# Patient Record
Sex: Female | Born: 1965 | Race: White | Hispanic: No | Marital: Married | Smoking: Current every day smoker
Health system: Southern US, Community
[De-identification: ages and names within clinical notes are randomized; demographics above are authoritative.]

## PROBLEM LIST (undated history)

## (undated) DIAGNOSIS — K429 Umbilical hernia without obstruction or gangrene: Secondary | ICD-10-CM

## (undated) DIAGNOSIS — J45909 Unspecified asthma, uncomplicated: Secondary | ICD-10-CM

## (undated) DIAGNOSIS — R011 Cardiac murmur, unspecified: Secondary | ICD-10-CM

## (undated) DIAGNOSIS — I10 Essential (primary) hypertension: Secondary | ICD-10-CM

## (undated) HISTORY — PX: WRIST SURGERY: SHX841

## (undated) HISTORY — PX: TONSILLECTOMY: SUR1361

## (undated) HISTORY — PX: DILATION AND CURETTAGE OF UTERUS: SHX78

---

## 2016-12-27 ENCOUNTER — Encounter: Payer: Self-pay | Admitting: Emergency Medicine

## 2016-12-27 ENCOUNTER — Emergency Department
Admission: EM | Admit: 2016-12-27 | Discharge: 2016-12-27 | Discharge status: Against Medical Advice | Payer: PRIVATE HEALTH INSURANCE | Attending: Emergency Medicine | Admitting: Emergency Medicine

## 2016-12-27 ENCOUNTER — Emergency Department: Payer: PRIVATE HEALTH INSURANCE

## 2016-12-27 DIAGNOSIS — J189 Pneumonia, unspecified organism: Secondary | ICD-10-CM | POA: Diagnosis not present

## 2016-12-27 DIAGNOSIS — Z5320 Procedure and treatment not carried out because of patient's decision for unspecified reasons: Secondary | ICD-10-CM | POA: Insufficient documentation

## 2016-12-27 DIAGNOSIS — I10 Essential (primary) hypertension: Secondary | ICD-10-CM | POA: Diagnosis not present

## 2016-12-27 DIAGNOSIS — F17200 Nicotine dependence, unspecified, uncomplicated: Secondary | ICD-10-CM | POA: Insufficient documentation

## 2016-12-27 DIAGNOSIS — J441 Chronic obstructive pulmonary disease with (acute) exacerbation: Secondary | ICD-10-CM

## 2016-12-27 DIAGNOSIS — R0602 Shortness of breath: Secondary | ICD-10-CM | POA: Diagnosis present

## 2016-12-27 HISTORY — DX: Essential (primary) hypertension: I10

## 2016-12-27 LAB — CBC
HCT: 43.9 % (ref 35.0–47.0)
Hemoglobin: 14.8 g/dL (ref 12.0–16.0)
MCH: 30.8 pg (ref 26.0–34.0)
MCHC: 33.8 g/dL (ref 32.0–36.0)
MCV: 91.2 fL (ref 80.0–100.0)
PLATELETS: 198 10*3/uL (ref 150–440)
RBC: 4.81 MIL/uL (ref 3.80–5.20)
RDW: 14.6 % — AB (ref 11.5–14.5)
WBC: 10.4 10*3/uL (ref 3.6–11.0)

## 2016-12-27 LAB — BASIC METABOLIC PANEL
Anion gap: 12 (ref 5–15)
BUN: 10 mg/dL (ref 6–20)
CALCIUM: 8.7 mg/dL — AB (ref 8.9–10.3)
CO2: 27 mmol/L (ref 22–32)
CREATININE: 0.81 mg/dL (ref 0.44–1.00)
Chloride: 101 mmol/L (ref 101–111)
GFR calc non Af Amer: >60 mL/min (ref >60)
Glucose, Bld: 88 mg/dL (ref 65–99)
Potassium: 3.4 mmol/L — ABNORMAL LOW (ref 3.5–5.1)
SODIUM: 140 mmol/L (ref 135–145)

## 2016-12-27 LAB — TROPONIN I

## 2016-12-27 MED ORDER — ALBUTEROL SULFATE (2.5 MG/3ML) 0.083% IN NEBU
5.0000 mg | INHALATION_SOLUTION | Freq: Once | RESPIRATORY_TRACT | Status: AC
  Administered 2016-12-27: 5 mg via RESPIRATORY_TRACT
  Filled 2016-12-27: qty 6

## 2016-12-27 MED ORDER — PREDNISONE 20 MG PO TABS
60.0000 mg | ORAL_TABLET | Freq: Once | ORAL | Status: AC
  Administered 2016-12-27: 60 mg via ORAL
  Filled 2016-12-27: qty 3

## 2016-12-27 MED ORDER — PREDNISONE 20 MG PO TABS: 40.0000 mg | ORAL_TABLET | Freq: Every day | ORAL | 0 refills | Status: DC

## 2016-12-27 MED ORDER — METOPROLOL TARTRATE 25 MG PO TABS
25.0000 mg | ORAL_TABLET | Freq: Two times a day (BID) | ORAL | 0 refills | Status: DC
Start: 2016-12-27 — End: 2017-05-07

## 2016-12-27 MED ORDER — DOXYCYCLINE HYCLATE 100 MG PO CAPS: 100.0000 mg | ORAL_CAPSULE | Freq: Two times a day (BID) | ORAL | 0 refills | Status: DC

## 2016-12-27 MED ORDER — ALBUTEROL SULFATE HFA 108 (90 BASE) MCG/ACT IN AERS: 2.0000 | INHALATION_SPRAY | RESPIRATORY_TRACT | 0 refills | Status: DC | PRN

## 2016-12-27 MED ORDER — HYDROCHLOROTHIAZIDE 25 MG PO TABS: 25.0000 mg | ORAL_TABLET | Freq: Every day | ORAL | 0 refills | Status: DC

## 2016-12-27 NOTE — ED Notes (Signed)
Pt stating that when she coughs she urinates. Given pads.

## 2016-12-27 NOTE — ED Notes (Signed)
Pt ambulatory to toilet by self.  

## 2016-12-27 NOTE — ED Provider Notes (Addendum)
[facility] Emergency Department Provider Note  ____________________________________________  Time seen: Approximately 4:41 PM  I have reviewed the triage vital signs and the nursing notes.   HISTORY  Chief Complaint Shortness of Breath    HPI Courtney Berger is a 51 y.o. female who complains of shortness of breath and nonproductive cough for the past 2 days. Feels like bronchitis that she's had in the past. She has an albuterol inhaler, but that she just moved to the stay, she still unpacking and couldn't find it. She also feels like she had a fever of 1022 days ago, which is now resolved. Has a history of hypertension, but ran out of her metoprolol and hydrochlorothiazide due to lack of access to primary care. Denies chest pain. Shortness of breath is constant, no aggravating or alleviating factors, mild to moderate intensity. No chills or sweats. No syncope.     Past Medical History:  Diagnosis Date  . Hypertension      There are no active problems to display for this patient.    Past Surgical History:  Procedure Laterality Date  . WRIST SURGERY       Prior to Admission medications   Medication Sig Start Date End Date Taking? Authorizing Provider  albuterol (PROVENTIL HFA) 108 (90 Base) MCG/ACT inhaler Inhale 2 puffs into the lungs every 4 (four) hours as needed for wheezing or shortness of breath. 12/27/16   Sharman Cheek, MD  doxycycline (VIBRAMYCIN) 100 MG capsule Take 1 capsule (100 mg total) by mouth 2 (two) times daily. 12/27/16   Sharman Cheek, MD  hydrochlorothiazide (HYDRODIURIL) 25 MG tablet Take 1 tablet (25 mg total) by mouth daily. 12/27/16   Sharman Cheek, MD  metoprolol tartrate (LOPRESSOR) 25 MG tablet Take 1 tablet (25 mg total) by mouth 2 (two) times daily. 12/27/16 12/27/17  Sharman Cheek, MD  predniSONE (DELTASONE) 20 MG tablet Take 2 tablets (40 mg total) by mouth daily. 12/27/16   Sharman Cheek, MD      Allergies Zyrtec [cetirizine]   No family history on file.  Social History Social History  Substance Use Topics  . Smoking status: Current Every Day Smoker    Packs/day: 0.50  . Smokeless tobacco: Never Used  . Alcohol use Yes    Review of Systems  Constitutional:   Positive fever, now resolved, without chills.  ENT:   Positive sore throat. No rhinorrhea. Cardiovascular:   No chest pain or syncope. Respiratory:   Positive shortness of breath and nonproductive cough. Gastrointestinal:   Negative for abdominal pain, vomiting and diarrhea.  Musculoskeletal:   Negative for focal pain or swelling All other systems reviewed and are negative except as documented above in ROS and HPI.  ____________________________________________   PHYSICAL EXAM:  VITAL SIGNS: ED Triage Vitals  Enc Vitals Group     BP 12/27/16 1537 (!) 198/100     Pulse Rate 12/27/16 1537 92     Resp 12/27/16 1537 20     Temp 12/27/16 1537 98.7 F (37.1 C)     Temp Source 12/27/16 1537 Oral     SpO2 12/27/16 1537 (!) 89 %     Weight 12/27/16 1538 260 lb (117.9 kg)     Height 12/27/16 1538 5\' 5"  (1.651 m)     Head Circumference --      Peak Flow --      Pain Score --      Pain Loc --      Pain Edu? --  Excl. in GC? --     Vital signs reviewed, nursing assessments reviewed.   Constitutional:   Alert and oriented. Well appearing and in no distress. Eyes:   No scleral icterus.  EOMI. No nystagmus. No conjunctival pallor. PERRL. ENT   Head:   Normocephalic and atraumatic.   Nose:   No congestion/rhinnorhea.    Mouth/Throat:   MMM, no pharyngeal erythema. No peritonsillar mass.    Neck:   No meningismus. Full ROM. Hematological/Lymphatic/Immunilogical:   No cervical lymphadenopathy. Cardiovascular:   RRR. Symmetric bilateral radial and DP pulses. Diffuse loud systolic murmur, radiating to right carotid, also radiating to left axilla.  Respiratory:   Normal respiratory effort  without tachypnea/retractions. Good air entry in all lung fields. Diffuse expiratory wheezing.. Gastrointestinal:   Soft and nontender. Non distended. There is no CVA tenderness.  No rebound, rigidity, or guarding. Genitourinary:   deferred Musculoskeletal:   Normal range of motion in all extremities. No joint effusions.  No lower extremity tenderness.  No edema. Neurologic:   Normal speech and language.  Motor grossly intact. No gross focal neurologic deficits are appreciated.  Skin:    Skin is warm, dry and intact. No rash noted.  No petechiae, purpura, or bullae.  ____________________________________________    LABS (pertinent positives/negatives) (all labs ordered are listed, but only abnormal results are displayed) Labs Reviewed  BASIC METABOLIC PANEL - Abnormal; Notable for the following:       Result Value   Potassium 3.4 (*)    Calcium 8.7 (*)    All other components within normal limits  CBC - Abnormal; Notable for the following:    RDW 14.6 (*)    All other components within normal limits  TROPONIN I   ____________________________________________   EKG  Interpreted by me Normal sinus rhythm rate of 87, normal axis intervals gross ST segments and T waves.  ____________________________________________    RADIOLOGY  Dg Chest 2 View  Result Date: 12/27/2016 CLINICAL DATA:  Cough and shortness of Breath EXAM: CHEST  2 VIEW COMPARISON:  None. FINDINGS: Cardiac shadow is within normal limits. Left nipple piercing is seen. No focal infiltrate or sizable effusion is noted. Degenerative changes of the thoracic spine are noted. IMPRESSION: No active cardiopulmonary disease. Electronically Signed   By: Alcide Clever M.D.   On: 12/27/2016 16:19    ____________________________________________   PROCEDURES Procedures  ____________________________________________   DIFFERENTIAL DIAGNOSIS  Pneumothorax, pneumonia, COPD exacerbation/acute bronchitis  CLINICAL  IMPRESSION / ASSESSMENT AND PLAN / ED COURSE  Pertinent labs & imaging results that were available during my care of the patient were reviewed by me and considered in my medical decision making (see chart for details).   Low suspicion of ACS dissection PE. Patient is not septic. Initially had a borderline low oxygen saturation, after bronchodilators and steroids, oxygenation is normalized. Symptoms are improved and wheezing is improved. Patient may be suitable for outpatient management with albuterol, prednisone, doxycycline and given likely underlying lung disease. Also refill her metoprolol and hydrochlorothiazide. Discussed with the patient her systolic murmur, given her age and the prominence of these murmurs, recommended she follow up with cardiology for echo. No evidence of heart failure or endocarditis.     ----------------------------------------- 8:52 PM on 12/27/2016 -----------------------------------------  On further assessment while planning for discharge, as noted in the patient's oxygen saturation Dropping into the mid 80s, down to 85%. Weight back and had a discussion with her recommended she stay in the hospital overnight due to persistent hypoxia  with acute respiratory failure, she refuses because she has a 10881-year-old at home, and her husband who is currently taking care of him is not the boy's biological father and she feels that her husband does not do a good job taking care of the 51-year-old, so she feels that she needs to be home to take care of the 51-year-old for his well-being. She has medical decision-making capacity so discharge her AGAINST MEDICAL ADVICE.  ____________________________________________   FINAL CLINICAL IMPRESSION(S) / ED DIAGNOSES    Final diagnoses:  COPD exacerbation (HCC)  Community acquired pneumonia, unspecified laterality  Hypertension, unspecified type      New Prescriptions   ALBUTEROL (PROVENTIL HFA) 108 (90 BASE) MCG/ACT INHALER     Inhale 2 puffs into the lungs every 4 (four) hours as needed for wheezing or shortness of breath.   DOXYCYCLINE (VIBRAMYCIN) 100 MG CAPSULE    Take 1 capsule (100 mg total) by mouth 2 (two) times daily.   HYDROCHLOROTHIAZIDE (HYDRODIURIL) 25 MG TABLET    Take 1 tablet (25 mg total) by mouth daily.   METOPROLOL TARTRATE (LOPRESSOR) 25 MG TABLET    Take 1 tablet (25 mg total) by mouth 2 (two) times daily.   PREDNISONE (DELTASONE) 20 MG TABLET    Take 2 tablets (40 mg total) by mouth daily.     Portions of this note were generated with dragon dictation software. Dictation errors may occur despite best attempts at proofreading.    Sharman CheekStafford, Dario Yono, MD 12/27/16 2022    Sharman CheekStafford, Milina Pagett, MD 12/27/16 Derek [id number]    Sharman CheekStafford, Xavyer Steenson, MD 12/27/16 [phone]

## 2016-12-27 NOTE — ED Notes (Signed)
Pt states when she coughs her oxygen drops but will come back up after coughing fit is done.

## 2016-12-27 NOTE — ED Notes (Signed)
Pt once again asked to stay d/t oxygen saturation and symptoms. Pt refusing to stay.

## 2016-12-27 NOTE — ED Notes (Addendum)
Pt states 3 days ago had a fever of 103. Since then unsure of fever. States she feels like she has bronchitis. States congestion, cough, "itchy" throat. Pt sounds congested. Pt states hx of HTN but hasn't taken medication x months d/t moving here and unable to set up PCP yet. Pt given resources for PCP in the area. Pt is alert, oriented, ambulatory. No resp distress noted at this time.   Pt states she was taking metoprolol and HCTZ for HTN.

## 2016-12-27 NOTE — ED Notes (Signed)
Pt oxygen saturations dropping. Pt refused to wear oxygen earlier stating that she is fine and it only happens when she coughs. This Rn went in to talk to pt about observing her oxygen. Pt states "I can't stay any more. No one has been in here. I got to get home to my 51 year old." pt informed that her oxygen is dropping without coughing while this RN is talking to pt. Dr. Scotty CourtStafford informed that pt states she will not stay for admission.

## 2016-12-27 NOTE — ED Triage Notes (Addendum)
Pt comes into the ED via POV c/o shortness of breath and productive cough.  Patient presents with audible wheezing and low O2 saturation at 89%-92% room air.  Patient denies any pain except when coughing.  States this has been going for 3-4 days.  Denies any chest pain or dizziness. Patient ambulatory to triage and has even and unlabored respirations.  States she had a fever of 103 two days ago.  Denies COPD or asthma.

## 2017-05-07 ENCOUNTER — Encounter: Payer: Self-pay | Admitting: Physician Assistant

## 2017-05-07 ENCOUNTER — Ambulatory Visit (INDEPENDENT_AMBULATORY_CARE_PROVIDER_SITE_OTHER): Payer: Self-pay | Admitting: Physician Assistant

## 2017-05-07 VITALS — BP 180/108 | HR 70 | Temp 98.5°F | Resp 16 | Ht 65.0 in | Wt 272.0 lb

## 2017-05-07 DIAGNOSIS — Z13220 Encounter for screening for lipoid disorders: Secondary | ICD-10-CM

## 2017-05-07 DIAGNOSIS — Z130 Encounter for screening for diseases of the blood and blood-forming organs and certain disorders involving the immune mechanism: Secondary | ICD-10-CM

## 2017-05-07 DIAGNOSIS — R0981 Nasal congestion: Secondary | ICD-10-CM

## 2017-05-07 DIAGNOSIS — Z720 Tobacco use: Secondary | ICD-10-CM

## 2017-05-07 DIAGNOSIS — R011 Cardiac murmur, unspecified: Secondary | ICD-10-CM

## 2017-05-07 DIAGNOSIS — I10 Essential (primary) hypertension: Secondary | ICD-10-CM

## 2017-05-07 DIAGNOSIS — M25562 Pain in left knee: Secondary | ICD-10-CM

## 2017-05-07 DIAGNOSIS — E669 Obesity, unspecified: Secondary | ICD-10-CM

## 2017-05-07 DIAGNOSIS — Z114 Encounter for screening for human immunodeficiency virus [HIV]: Secondary | ICD-10-CM

## 2017-05-07 MED ORDER — METOPROLOL TARTRATE 25 MG PO TABS: 25.0000 mg | ORAL_TABLET | Freq: Two times a day (BID) | ORAL | 0 refills | Status: DC

## 2017-05-07 NOTE — Progress Notes (Signed)
Patient: Courtney Berger Female    DOB: 02/18/1966   52 y.o.   MRN: [id number] Visit Date: 05/07/2017  Today's Provider: Trey SailorsAdriana M Shantale Holtmeyer, PA-C   Chief Complaint  Patient presents with  . Establish Care  . Hypertension   Subjective:    Courtney Berger is a 52 y/o woman who presents here with her husband today to establish care. She has previously been seen by multiple PCPs but nobody consistently.  She lives in [place]. She has seven biological children. She lives with her husband and [id number] year old son. She works at [employer] as a Conservation officer, naturecashier.  She does not use alcohol or drugs. She is currently smoking 1/2 pack per day. She reports she has smoked since she was 15 for a total of 37 years. She reports she has smoked more than half a pack at one point but doesn't recall how much and for how long.  She has a longstanding history of hypertension. She reports most recently she was Rx metoprolol 25 mg BID and 12.5 mg HCTZ from the ER. She only had a month's worth of medication. She hasn't been taking it consistently because she didn't want to run out. She has been taking one about every other day. She has run out of her HCTZ.    She also reports a history of a murmur that was again demonstrated in the ER. She reports she has never been worked up for this but it has been present since she was a child.   She also brings up weight loss and wants to know why she had gained 40lbs when she moved here from [place]. She says it's likely because her husband makes good food and she does nothing all day.  She also brings up sinus congestion and wants to know what she can do about it.   She also brings up left knee pain that swells intermittently from her left knee down. She wants to know if this is related to her BP. She recalls no injuries to this leg. 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 She is on her feet all day at work. She has no numbness or tingling. She feels she may be retaining  fluid.   She did not fast today, she had tea with honey and gummy bears for breakfast.  Hypertension  This is a new problem. The problem is uncontrolled. Pertinent negatives include no anxiety, blurred vision, chest pain, headaches, malaise/fatigue, neck pain, orthopnea, palpitations, peripheral edema, PND, shortness of breath or sweats. There are no associated agents to hypertension.       Allergies  Allergen Reactions  . Zyrtec [Cetirizine] Itching     Current Outpatient Medications:  .  albuterol (PROVENTIL HFA) 108 (90 Base) MCG/ACT inhaler, Inhale 2 puffs into the lungs every 4 (four) hours as needed for wheezing or shortness of breath., Disp: 1 Inhaler, Rfl: 0 .  hydrochlorothiazide (HYDRODIURIL) 25 MG tablet, Take 1 tablet (25 mg total) by mouth daily., Disp: 30 tablet, Rfl: 0 .  metoprolol tartrate (LOPRESSOR) 25 MG tablet, Take 1 tablet (25 mg total) by mouth 2 (two) times daily., Disp: 60 tablet, Rfl: 0 .  doxycycline (VIBRAMYCIN) 100 MG capsule, Take 1 capsule (100 mg total) by mouth 2 (two) times daily., Disp: 28 capsule, Rfl: 0 .  predniSONE (DELTASONE) 20 MG tablet, Take 2 tablets (40 mg total) by mouth daily., Disp: 8 tablet, Rfl: 0  Review of Systems  Constitutional:  Negative.  Negative for malaise/fatigue.  HENT: Negative.   Eyes: Negative.  Negative for blurred vision.  Respiratory: Negative.  Negative for shortness of breath.   Cardiovascular: Negative.  Negative for chest pain, palpitations, orthopnea and PND.  Gastrointestinal: Negative.   Endocrine: Negative.   Genitourinary: Negative.   Musculoskeletal: Negative.  Negative for neck pain.  Skin: Negative.   Allergic/Immunologic: Negative.   Neurological: Negative.  Negative for headaches.  Hematological: Negative.   Psychiatric/Behavioral: Negative.     Social History   Tobacco Use  . Smoking status: Current Every Day Smoker    Packs/day: 0.50  . Smokeless tobacco: Never Used  Substance Use Topics    . Alcohol use: No    Frequency: Never   Objective:   BP (!) 180/108 (BP Location: Right Arm, Patient Position: Sitting, Cuff Size: Large)   Pulse 70   Temp 98.5 F (36.9 C) (Oral)   Resp 16   Ht 5\' 5"  (1.651 m)   Wt 272 lb (123.4 kg)   BMI 45.26 kg/m  Vitals:   05/07/17 0928  BP: (!) 180/108  Pulse: 70  Resp: 16  Temp: 98.5 F (36.9 C)  TempSrc: Oral  Weight: 272 lb (123.4 kg)  Height: 5\' 5"  (1.651 m)     Physical Exam  Constitutional: She is oriented to person, place, and time. She appears well-developed and well-nourished.  HENT:  Right Ear: External ear normal.  Left Ear: External ear normal.  Mouth/Throat: Oropharynx is clear and moist. No oropharyngeal exudate.  Eyes: Conjunctivae are normal.  Neck: Neck supple. No thyromegaly present.  Cardiovascular: Normal rate and regular rhythm.  Murmur heard.  Systolic murmur is present with a grade of 3/6. Systolic murmur best heard in the RUSB.  Pulmonary/Chest: Effort normal and breath sounds normal.  Abdominal: Soft. Bowel sounds are normal.  Musculoskeletal:  Adiposity of of thighs bilaterally makes palpation of landmarks difficult. Left knee is non tender to palpation. Crepitus felt with flexion and extension. No weakness noted. No erythema or redness.   Lymphadenopathy:    She has no cervical adenopathy.  Neurological: She is alert and oriented to person, place, and time.  Skin: Skin is warm and dry.  Psychiatric: She has a normal mood and affect. Her behavior is normal.        Assessment & Plan:     1. Hypertension, unspecified type  She was prescribed metoprolol and HCTZ by the ER. She hasn't been taking them consistently because she only had a month's supply and she got these medications in October. I have communicated that I do not think metoprolol is a good first line medication as it will not be enough to lower her BP. She doesn't want to take amlodipine because of swelling. I also do not think HCTZ is  good first line for this high of BP. I do not have recent labwork on her so will start back metoprolol and have her follow up. Will likely need to add another medication.  - Comprehensive Metabolic Panel (CMET) - metoprolol tartrate (LOPRESSOR) 25 MG tablet; Take 1 tablet (25 mg total) by mouth 2 (two) times daily.  Dispense: 180 tablet; Refill: 0  2. Obesity with serious comorbidity, unspecified classification, unspecified obesity type  Keep a food log and bring it to next visit.  - TSH  3. Tobacco abuse   4. Encounter for screening for HIV  - HIV antibody (with reflex)  5. Screening for deficiency anemia  - CBC with Differential  6. Screening cholesterol level  - Lipid Profile  7. Systolic murmur  - Ambulatory referral to Cardiology  8. Sinus congestion  Take 2nd gen antihistamine daily.  9. Left knee pain, unspecified chronicity  Suspect arthritis. Anti-inflammatories to tx, weight loss advised. Xray if not improving.  Return in about 2 weeks (around 05/21/2017) for HTN.  The entirety of the information documented in the History of Present Illness, Review of Systems and Physical Exam were personally obtained by me. Portions of this information were initially documented by Kavin Leech, CMA and reviewed by me for thoroughness and accuracy.          Trey Sailors, PA-C  [facility]

## 2017-05-07 NOTE — Patient Instructions (Signed)

## 2017-05-08 LAB — CBC WITH DIFFERENTIAL/PLATELET
Basophils Absolute: 0 x10E3/uL (ref 0.0–0.2)
Basos: 0 %
EOS (ABSOLUTE): 0.1 x10E3/uL (ref 0.0–0.4)
Eos: 1 %
Hematocrit: 43.6 % (ref 34.0–46.6)
Hemoglobin: 14.7 g/dL (ref 11.1–15.9)
Immature Grans (Abs): 0 x10E3/uL (ref 0.0–0.1)
Immature Granulocytes: 0 %
Lymphocytes Absolute: 2.3 x10E3/uL (ref 0.7–3.1)
Lymphs: 31 %
MCH: 30.5 pg (ref 26.6–33.0)
MCHC: 33.7 g/dL (ref 31.5–35.7)
MCV: 91 fL (ref 79–97)
Monocytes Absolute: 0.5 x10E3/uL (ref 0.1–0.9)
Monocytes: 6 %
Neutrophils Absolute: 4.6 x10E3/uL (ref 1.4–7.0)
Neutrophils: 62 %
Platelets: 236 x10E3/uL (ref 150–379)
RBC: 4.82 x10E6/uL (ref 3.77–5.28)
RDW: 14.4 % (ref 12.3–15.4)
WBC: 7.5 x10E3/uL (ref 3.4–10.8)

## 2017-05-08 LAB — COMPREHENSIVE METABOLIC PANEL WITH GFR
ALT: 9 IU/L (ref 0–32)
AST: 13 IU/L (ref 0–40)
Albumin/Globulin Ratio: 1.6 (ref 1.2–2.2)
Albumin: 4.6 g/dL (ref 3.5–5.5)
Alkaline Phosphatase: 66 IU/L (ref 39–117)
BUN/Creatinine Ratio: 16 (ref 9–23)
BUN: 13 mg/dL (ref 6–24)
Bilirubin Total: 0.3 mg/dL (ref 0.0–1.2)
CO2: 26 mmol/L (ref 20–29)
Calcium: 9.4 mg/dL (ref 8.7–10.2)
Chloride: 100 mmol/L (ref 96–106)
Creatinine, Ser: 0.81 mg/dL (ref 0.57–1.00)
GFR calc Af Amer: 97 mL/min/1.73
GFR calc non Af Amer: 84 mL/min/1.73
Globulin, Total: 2.8 g/dL (ref 1.5–4.5)
Glucose: 67 mg/dL (ref 65–99)
Potassium: 4.2 mmol/L (ref 3.5–5.2)
Sodium: 142 mmol/L (ref 134–144)
Total Protein: 7.4 g/dL (ref 6.0–8.5)

## 2017-05-08 LAB — HIV ANTIBODY (ROUTINE TESTING W REFLEX): HIV Screen 4th Generation wRfx: NONREACTIVE

## 2017-05-08 LAB — TSH: TSH: 0.963 u[IU]/mL (ref 0.450–4.500)

## 2017-05-08 LAB — LIPID PANEL
Chol/HDL Ratio: 4.9 ratio — ABNORMAL HIGH (ref 0.0–4.4)
Cholesterol, Total: 233 mg/dL — ABNORMAL HIGH (ref 100–199)
HDL: 48 mg/dL
LDL Calculated: 155 mg/dL — ABNORMAL HIGH (ref 0–99)
Triglycerides: 149 mg/dL (ref 0–149)
VLDL Cholesterol Cal: 30 mg/dL (ref 5–40)

## 2017-05-09 DIAGNOSIS — E669 Obesity, unspecified: Secondary | ICD-10-CM | POA: Insufficient documentation

## 2017-05-09 DIAGNOSIS — Z720 Tobacco use: Secondary | ICD-10-CM | POA: Insufficient documentation

## 2017-05-09 DIAGNOSIS — R011 Cardiac murmur, unspecified: Secondary | ICD-10-CM | POA: Insufficient documentation

## 2017-05-10 ENCOUNTER — Telehealth: Payer: Self-pay

## 2017-05-10 NOTE — Telephone Encounter (Signed)
-----   Message from Trey SailorsAdriana M Pollak, [place]-C sent at 05/10/2017  1:39 PM EST ----- Labs are normal but for her cholesterol. While elevated, she will not need treatment if we can control her BP and work on quitting smoking. See her at follow up.

## 2017-05-10 NOTE — Telephone Encounter (Signed)
Pt advised.   Thanks,   -Cadyn Rodger  

## 2017-05-22 ENCOUNTER — Encounter: Payer: Self-pay | Admitting: Physician Assistant

## 2017-05-22 ENCOUNTER — Ambulatory Visit: Payer: Medicaid Other | Admitting: Physician Assistant

## 2017-05-22 VITALS — BP 174/108 | HR 72 | Temp 98.9°F | Resp 16 | Wt 274.0 lb

## 2017-05-22 DIAGNOSIS — Z598 Other problems related to housing and economic circumstances: Secondary | ICD-10-CM

## 2017-05-22 DIAGNOSIS — Z599 Problem related to housing and economic circumstances, unspecified: Secondary | ICD-10-CM

## 2017-05-22 DIAGNOSIS — I10 Essential (primary) hypertension: Secondary | ICD-10-CM

## 2017-05-22 MED ORDER — AMLODIPINE BESYLATE 10 MG PO TABS: ORAL_TABLET | ORAL | 0 refills | Status: DC

## 2017-05-22 NOTE — Patient Instructions (Addendum)
Dr. Mariah MillingGollan, appointment 06/24/2017 at 10:00 AM [phone]      Hypertension Hypertension is another name for high blood pressure. High blood pressure forces your heart to work harder to pump blood. This can cause problems over time. There are two numbers in a blood pressure reading. There is a top number (systolic) over a bottom number (diastolic). It is best to have a blood pressure below 120/80. Healthy choices can help lower your blood pressure. You may need medicine to help lower your blood pressure if:  Your blood pressure cannot be lowered with healthy choices.  Your blood pressure is higher than 130/80.  Follow these instructions at home: Eating and drinking  If directed, follow the DASH eating plan. This diet includes: ? Filling half of your plate at each meal with fruits and vegetables. ? Filling one quarter of your plate at each meal with whole grains. Whole grains include whole wheat pasta, brown rice, and whole grain bread. ? Eating or drinking low-fat dairy products, such as skim milk or low-fat yogurt. ? Filling one quarter of your plate at each meal with low-fat (lean) proteins. Low-fat proteins include fish, skinless chicken, eggs, beans, and tofu. ? Avoiding fatty meat, cured and processed meat, or chicken with skin. ? Avoiding premade or processed food.  Eat less than 1,500 mg of salt (sodium) a day.  Limit alcohol use to no more than 1 drink a day for nonpregnant women and 2 drinks a day for men. One drink equals 12 oz of beer, 5 oz of wine, or 1 oz of hard liquor. Lifestyle  Work with your doctor to stay at a healthy weight or to lose weight. Ask your doctor what the best weight is for you.  Get at least 30 minutes of exercise that causes your heart to beat faster (aerobic exercise) most days of the week. This may include walking, swimming, or biking.  Get at least 30 minutes of exercise that strengthens your muscles (resistance exercise) at least 3 days a  week. This may include lifting weights or pilates.  Do not use any products that contain nicotine or tobacco. This includes cigarettes and e-cigarettes. If you need help quitting, ask your doctor.  Check your blood pressure at home as told by your doctor.  Keep all follow-up visits as told by your doctor. This is important. Medicines  Take over-the-counter and prescription medicines only as told by your doctor. Follow directions carefully.  Do not skip doses of blood pressure medicine. The medicine does not work as well if you skip doses. Skipping doses also puts you at risk for problems.  Ask your doctor about side effects or reactions to medicines that you should watch for. Contact a doctor if:  You think you are having a reaction to the medicine you are taking.  You have headaches that keep coming back (recurring).  You feel dizzy.  You have swelling in your ankles.  You have trouble with your vision. Get help right away if:  You get a very bad headache.  You start to feel confused.  You feel weak or numb.  You feel faint.  You get very bad pain in your: ? Chest. ? Belly (abdomen).  You throw up (vomit) more than once.  You have trouble breathing. Summary  Hypertension is another name for high blood pressure.  Making healthy choices can help lower blood pressure. If your blood pressure cannot be controlled with healthy choices, you may need to take medicine. This information  This information is not intended to replace advice given to you by your health care provider. Make sure you discuss any questions you have with your health care provider. Document Released: 08/08/2007 Document Revised: 01/18/2016 Document Reviewed: 01/18/2016 Elsevier Interactive Patient Education  2018 [employer]  
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
## 2017-06-20 ENCOUNTER — Ambulatory Visit: Payer: Self-pay | Admitting: Physician Assistant

## 2017-06-24 ENCOUNTER — Ambulatory Visit: Payer: PRIVATE HEALTH INSURANCE | Admitting: Cardiovascular Disease

## 2017-08-20 ENCOUNTER — Other Ambulatory Visit: Payer: Self-pay

## 2017-08-20 DIAGNOSIS — I10 Essential (primary) hypertension: Secondary | ICD-10-CM

## 2017-08-20 NOTE — Telephone Encounter (Signed)
Pt requesting refill of BP amlodipine and metoprolol. [facility].

## 2017-08-21 ENCOUNTER — Other Ambulatory Visit: Payer: Self-pay | Admitting: Physician Assistant

## 2017-08-21 DIAGNOSIS — I10 Essential (primary) hypertension: Secondary | ICD-10-CM

## 2017-08-21 MED ORDER — AMLODIPINE BESYLATE 10 MG PO TABS
5.0000 mg | ORAL_TABLET | Freq: Every day | ORAL | 0 refills | Status: DC
Start: 2017-08-21 — End: 2019-02-06

## 2017-08-21 MED ORDER — METOPROLOL TARTRATE 25 MG PO TABS: 25.0000 mg | ORAL_TABLET | Freq: Two times a day (BID) | ORAL | 0 refills | Status: DC

## 2017-08-21 NOTE — Telephone Encounter (Signed)
Needs OV before more refills. 

## 2019-01-31 ENCOUNTER — Emergency Department: Payer: Medicaid Other

## 2019-01-31 ENCOUNTER — Emergency Department
Admission: EM | Admit: 2019-01-31 | Discharge: 2019-01-31 | Disposition: A | Discharge status: Home / Self Care | Payer: Medicaid Other | Attending: Student | Admitting: Student

## 2019-01-31 ENCOUNTER — Other Ambulatory Visit: Payer: Self-pay

## 2019-01-31 ENCOUNTER — Encounter: Payer: Self-pay | Admitting: Emergency Medicine

## 2019-01-31 DIAGNOSIS — K429 Umbilical hernia without obstruction or gangrene: Secondary | ICD-10-CM

## 2019-01-31 DIAGNOSIS — R1907 Generalized intra-abdominal and pelvic swelling, mass and lump: Secondary | ICD-10-CM | POA: Diagnosis present

## 2019-01-31 DIAGNOSIS — F17210 Nicotine dependence, cigarettes, uncomplicated: Secondary | ICD-10-CM | POA: Insufficient documentation

## 2019-01-31 DIAGNOSIS — I10 Essential (primary) hypertension: Secondary | ICD-10-CM | POA: Insufficient documentation

## 2019-01-31 DIAGNOSIS — K5900 Constipation, unspecified: Secondary | ICD-10-CM | POA: Insufficient documentation

## 2019-01-31 LAB — COMPREHENSIVE METABOLIC PANEL
ALT: 14 U/L (ref 0–44)
AST: 18 U/L (ref 15–41)
Albumin: 4.4 g/dL (ref 3.5–5.0)
Alkaline Phosphatase: 51 U/L (ref 38–126)
Anion gap: 11 (ref 5–15)
BUN: 27 mg/dL — ABNORMAL HIGH (ref 6–20)
CO2: 26 mmol/L (ref 22–32)
Calcium: 9.4 mg/dL (ref 8.9–10.3)
Chloride: 102 mmol/L (ref 98–111)
Creatinine, Ser: 0.96 mg/dL (ref 0.44–1.00)
GFR calc Af Amer: >60 mL/min (ref >60)
GFR calc non Af Amer: >60 mL/min (ref >60)
Glucose, Bld: 87 mg/dL (ref 70–99)
Potassium: 4 mmol/L (ref 3.5–5.1)
Sodium: 139 mmol/L (ref 135–145)
Total Bilirubin: 0.5 mg/dL (ref 0.3–1.2)
Total Protein: 8.2 g/dL — ABNORMAL HIGH (ref 6.5–8.1)

## 2019-01-31 LAB — CBC WITH DIFFERENTIAL/PLATELET
Abs Immature Granulocytes: 0.04 10*3/uL (ref 0.00–0.07)
Basophils Absolute: 0 10*3/uL (ref 0.0–0.1)
Basophils Relative: 0 %
Eosinophils Absolute: 0.2 10*3/uL (ref 0.0–0.5)
Eosinophils Relative: 2 %
HCT: 42.6 % (ref 36.0–46.0)
Hemoglobin: 14.6 g/dL (ref 12.0–15.0)
Immature Granulocytes: 0 %
Lymphocytes Relative: 34 %
Lymphs Abs: 3.4 10*3/uL (ref 0.7–4.0)
MCH: 30.5 pg (ref 26.0–34.0)
MCHC: 34.3 g/dL (ref 30.0–36.0)
MCV: 88.9 fL (ref 80.0–100.0)
Monocytes Absolute: 0.5 10*3/uL (ref 0.1–1.0)
Monocytes Relative: 5 %
Neutro Abs: 5.7 10*3/uL (ref 1.7–7.7)
Neutrophils Relative %: 59 %
Platelets: 219 10*3/uL (ref 150–400)
RBC: 4.79 MIL/uL (ref 3.87–5.11)
RDW: 13.6 % (ref 11.5–15.5)
WBC: 9.9 10*3/uL (ref 4.0–10.5)
nRBC: 0 % (ref 0.0–0.2)

## 2019-01-31 LAB — LACTIC ACID, PLASMA: Lactic Acid, Venous: 0.8 mmol/L (ref 0.5–1.9)

## 2019-01-31 LAB — PROTIME-INR
INR: 0.9 (ref 0.8–1.2)
Prothrombin Time: 12.3 seconds (ref 11.4–15.2)

## 2019-01-31 MED ORDER — ALBUTEROL SULFATE HFA 108 (90 BASE) MCG/ACT IN AERS: 2.0000 | INHALATION_SPRAY | RESPIRATORY_TRACT | 0 refills | Status: DC | PRN

## 2019-01-31 MED ORDER — IOHEXOL 9 MG/ML PO SOLN
500.0000 mL | ORAL | Status: AC
  Administered 2019-01-31 (×2): 500 mL via ORAL
  Filled 2019-01-31 (×2): qty 500

## 2019-01-31 NOTE — ED Notes (Addendum)
Pt to the er for pain to the abd just above the umbilicus. Pt has a small palpable area that is tender but not painful with palpation. Pt reports the pain began after lifting something heavy at work the other night. Pt states she has a hx of possible hernia years ago but no surgical intervention required.

## 2019-01-31 NOTE — ED Provider Notes (Signed)
[facility] Emergency Department Provider Note ____________________________________________  Time seen: 1810  I have reviewed the triage vital signs and the nursing notes.  HISTORY  Chief Complaint  possible hernia   HPI Courtney Berger is a 53 y.o. female presents to the clinic today with complaint of abdominal mass.  She noticed this yesterday while lifting boxes at work.  She reports that the area is uncomfortable and tender to touch.  She denies nausea, vomiting, diarrhea or blood in her stool.  She has constipation at baseline.  She has been told that she had an umbilical hernia in the past but she reports this is much bigger and harder than previous.  She took a leftover Percocet with some relief.  Past Medical History:  Diagnosis Date  . Hypertension     Patient Active Problem List   Diagnosis Date Noted  . Obesity with serious comorbidity 05/09/2017  . Tobacco abuse 05/09/2017  . Systolic murmur 99/37/1696  . Hypertension 05/07/2017    Past Surgical History:  Procedure Laterality Date  . DILATION AND CURETTAGE OF UTERUS    . WRIST SURGERY Right     Prior to Admission medications   Medication Sig Start Date End Date Taking? Authorizing Provider  albuterol (PROVENTIL HFA) 108 (90 Base) MCG/ACT inhaler Inhale 2 puffs into the lungs every 4 (four) hours as needed for wheezing or shortness of breath. Patient not taking: Reported on 05/22/2017 12/27/16   Carrie Mew, MD  amLODipine (NORVASC) 10 MG tablet Take 0.5 tablets (5 mg total) by mouth daily. For the first week, take 1/2 pill daily. On week 2, may take full pill daily. 08/21/17 09/20/17  Trinna Post, PA-C  hydrochlorothiazide (HYDRODIURIL) 25 MG tablet Take 1 tablet (25 mg total) by mouth daily. Patient not taking: Reported on 05/22/2017 12/27/16   Carrie Mew, MD  metoprolol tartrate (LOPRESSOR) 25 MG tablet Take 1 tablet (25 mg total) by mouth 2 (two) times daily. 08/21/17  09/20/17  Trinna Post, PA-C    Allergies Zyrtec [cetirizine]  Family History  Problem Relation Age of Onset  . Diabetes Mother   . Congestive Heart Failure Mother   . Hypertension Mother   . Hyperlipidemia Mother   . Parkinson's disease Father   . Hypertension Father   . Diabetes Father   . Dementia Father   . Diabetes Maternal Grandmother   . Colon cancer Maternal Grandfather   . Alzheimer's disease Paternal Grandmother   . Diabetes Paternal Grandmother   . Angina Paternal Grandfather   . Lung cancer Paternal Grandfather     Social History Social History   Tobacco Use  . Smoking status: Current Every Day Smoker    Packs/day: 0.50  . Smokeless tobacco: Never Used  Substance Use Topics  . Alcohol use: No    Frequency: Never  . Drug use: No    Review of Systems  Constitutional: Negative for fever, chills or body aches. Cardiovascular: Negative for chest pain or chest tightness. Respiratory: Negative for cough or shortness of breath. Gastrointestinal: Positive for abdominal mass with pain.  Negative for nausea vomiting, diarrhea or blood in stool. Skin: Negative for rash.  ____________________________________________  PHYSICAL EXAM:  VITAL SIGNS: ED Triage Vitals  Enc Vitals Group     BP 01/31/19 1628 (!) 169/92     Pulse Rate 01/31/19 1628 76     Resp 01/31/19 1628 20     Temp 01/31/19 1628 98.5 F (36.9 C)     Temp Source  01/31/19 1628 Oral     SpO2 01/31/19 1628 98 %     Weight 01/31/19 1629 250 lb (113.4 kg)     Height 01/31/19 1629 5\' 5"  (1.651 m)     Head Circumference --      Peak Flow --      Pain Score 01/31/19 1628 4     Pain Loc --      Pain Edu? --      Excl. in GC? --     Constitutional: Alert and oriented.  Obese but in no distress. Cardiovascular: Normal rate, regular rhythm.  Respiratory: Normal respiratory effort. No wheezes/rales/rhonchi. Gastrointestinal: 3 cm round palpable mass noted just above the umbilicus.  Bowel sounds  noted in all 4 quadrants.  Pain with palpation directly over the abdominal mass. Neurologic: Normal speech and language. No gross focal neurologic deficits are appreciated.  ____________________________________________   RADIOLOGY   Imaging Orders     CT ABDOMEN PELVIS WO CONTRAST IMPRESSION:  Umbilical hernia containing fat. There is stranding in the hernia  sac suggesting possibility of incarceration/strangulation.    Aortic atherosclerosis.    Punctate left nephrolithiasis. No hydronephrosis.    ____________________________________________   INITIAL IMPRESSION / ASSESSMENT AND PLAN / ED COURSE  Abdominal Mass with Pain:  CT abdomen shows possible incarcerated/strangulated hernia Will obtain CBC, CMET and lactic acid General surgery paged- handoff given to 02/02/19, PA-C at 2100 ____________________________________________  FINAL CLINICAL IMPRESSION(S) / ED DIAGNOSES  Final diagnoses:  None      2101., MD 02/02/19 1023

## 2019-01-31 NOTE — ED Notes (Signed)
Pt to follow up with [facility] surgery tomorrow. Pt instructed on how to cough and support hernia area. Pt instructed not to lift heavy objects.

## 2019-01-31 NOTE — Discharge Instructions (Addendum)
Please return to the emergency department over the weekend for fever, if you begin to feel the bulge again, your pain returns or any other symptoms concerning to you.  Call Dr. Ines Bloomer office on Monday for a follow-up appointment

## 2019-01-31 NOTE — ED Provider Notes (Signed)
-----------------------------------------   11:01 PM on 01/31/2019 -----------------------------------------  Blood pressure (!) 156/95, pulse 70, temperature 97.8 F (36.6 C), temperature source Oral, resp. rate 18, height 5\' 5"  (1.651 m), weight 113.4 kg, SpO2 100 %.  Assuming care from [facility].  In short, Courtney Berger is a 53 y.o. female with a chief complaint of possible hernia .  T scan shows umbilical hernia.  Lab work is all reassuring.  Dr. Lysle Pearl came down to personally evaluate the patient.  He was able to reduce the hernia.  He recommends follow-up with him in clinic next week.  Refer to the original H&P for additional details.  The current plan of care is to follow-up with Dr. Lysle Pearl with general surgery next week.  Patient is agreeable to this plan.    Laban Emperor, PA-C 01/31/19 2304    Harvest Dark, MD 02/01/19 [phone]

## 2019-01-31 NOTE — ED Notes (Signed)
Dr Sakai at bedside.  ?

## 2019-01-31 NOTE — Consult Note (Addendum)
Subjective:   CC: Umbilical hernia  HPI:  Courtney Berger is a 53 y.o. female who was referred by [facility] for evaluation of above cc.   Symptoms were first noted 1 day ago. Pain is sharp, localized around the periumbilical region.  Previous CT reported history of an umbilical hernia but was not noticeable and asymptomatic until yesterday.  Pain worsened after lifting a heavy object she took 1 Percocet at home that night, did feel like pain has been improving since.  The large lump that was palpable there has also become smaller per report..  Associated with nothing specific, exacerbated by palpation lump Is not totally reducible.     Past Medical History:  has a past medical history of Hypertension.  Past Surgical History:  Past Surgical History:  Procedure Laterality Date  . DILATION AND CURETTAGE OF UTERUS    . WRIST SURGERY Right     Family History: family history includes Alzheimer's disease in her paternal grandmother; Angina in her paternal grandfather; Colon cancer in her maternal grandfather; Congestive Heart Failure in her mother; Dementia in her father; Diabetes in her father, maternal grandmother, mother, and paternal grandmother; Hyperlipidemia in her mother; Hypertension in her father and mother; Lung cancer in her paternal grandfather; Parkinson's disease in her father.  Social History:  reports that she has been smoking. She has been smoking about 0.50 packs per day. She has never used smokeless tobacco. She reports that she does not drink alcohol or use drugs.  Current Medications: (Not in a hospital admission)   Allergies:  Allergies as of 01/31/2019 - Review Complete 01/31/2019  Allergen Reaction Noted  . Zyrtec [cetirizine] Itching 12/27/2016    ROS:  General: Denies weight loss, weight gain, fatigue, fevers, chills, and night sweats. Eyes: Denies blurry vision, double vision, eye pain, itchy eyes, and tearing. Ears: Denies hearing loss, earache, and ringing in  ears. Nose: Denies sinus pain, congestion, infections, runny nose, and nosebleeds. Mouth/throat: Denies hoarseness, sore throat, bleeding gums, and difficulty swallowing. Heart: Denies chest pain, palpitations, racing heart, irregular heartbeat, leg pain or swelling, and decreased activity tolerance. Respiratory: Denies breathing difficulty, shortness of breath, wheezing, cough, and sputum. GI: Denies change in appetite, heartburn, nausea, vomiting, constipation, diarrhea, and blood in stool. GU: Denies difficulty urinating, pain with urinating, urgency, frequency, blood in urine. Musculoskeletal: Denies joint stiffness, pain, swelling, muscle weakness. Skin: Denies rash, itching, mass, tumors, sores, and boils Neurologic: Denies headache, fainting, dizziness, seizures, numbness, and tingling. Psychiatric: Denies depression, anxiety, difficulty sleeping, and memory loss. Endocrine: Denies heat or cold intolerance, and increased thirst or urination. Blood/lymph: Denies easy bruising, easy bruising, and swollen glands     Objective:     BP (!) 169/92   Pulse 76   Temp 98.5 F (36.9 C) (Oral)   Resp 20   Ht 5\' 5"  (1.651 m)   Wt 113.4 kg   SpO2 98%   BMI 41.60 kg/m   Constitutional :  alert, cooperative, appears stated age and no distress  Lymphatics/Throat:  no asymmetry, masses, or scars  Respiratory:  clear to auscultation bilaterally  Cardiovascular:  regular rate and rhythm  Gastrointestinal: Soft no guarding.  Umbilical hernia noted.  Tender to palpation, no overlying skin changes, and partially reducible.  Musculoskeletal: l steady gait and movement  Skin: Cool and moist  Psychiatric: Normal affect, non-agitated, not confused       LABS:  CMP Latest Ref Rng & Units 01/31/2019 05/07/2017 12/27/2016  Glucose 70 - 99 mg/dL 87  67 88  BUN 6 - 20 mg/dL 27(H) 13 10  Creatinine 0.44 - 1.00 mg/dL 0.96 0.81 0.81  Sodium 135 - 145 mmol/L 139 142 140  Potassium 3.5 - 5.1 mmol/L 4.0  4.2 3.4(L)  Chloride 98 - 111 mmol/L 102 100 101  CO2 22 - 32 mmol/L 26 26 27   Calcium 8.9 - 10.3 mg/dL 9.4 9.4 8.7(L)  Total Protein 6.5 - 8.1 g/dL 8.2(H) 7.4 -  Total Bilirubin 0.3 - 1.2 mg/dL 0.5 0.3 -  Alkaline Phos 38 - 126 U/L 51 66 -  AST 15 - 41 U/L 18 13 -  ALT 0 - 44 U/L 14 9 -   CBC Latest Ref Rng & Units 01/31/2019 05/07/2017 12/27/2016  WBC 4.0 - 10.5 K/uL 9.9 7.5 10.4  Hemoglobin 12.0 - 15.0 g/dL 14.6 14.7 14.8  Hematocrit 36.0 - 46.0 % 42.6 43.6 43.9  Platelets 150 - 400 K/uL 219 236 198    RADS: CLINICAL DATA:  Abdominal pain  EXAM: CT ABDOMEN AND PELVIS WITHOUT CONTRAST  TECHNIQUE: Multidetector CT imaging of the abdomen and pelvis was performed following the standard protocol without IV contrast.  COMPARISON:  None.  FINDINGS: Lower chest: Lung bases are clear. No effusions. Heart is normal size.  Hepatobiliary: No focal hepatic abnormality. Gallbladder unremarkable.  Pancreas: No focal abnormality or ductal dilatation.  Spleen: No focal abnormality.  Normal size.  Adrenals/Urinary Tract: Punctate nonobstructing stones in the left kidney. No hydronephrosis. No renal or adrenal mass. Urinary bladder unremarkable.  Stomach/Bowel: Normal appendix. Stomach, large and small bowel grossly unremarkable.  Vascular/Lymphatic: Aortic atherosclerosis. No enlarged abdominal or pelvic lymph nodes.  Reproductive: Uterus and adnexa unremarkable.  No mass.  Other: Umbilical hernia containing fat. There is some stranding within the fat within the hernia sac suggesting possible incarceration/strangulation. Small right femoral hernia containing fat. No free fluid or free air.  Musculoskeletal: No acute bony abnormality. Degenerative facet disease and disc disease in the lower lumbar spine.  IMPRESSION: Umbilical hernia containing fat. There is stranding in the hernia sac suggesting possibility of incarceration/strangulation.  Aortic  atherosclerosis.  Punctate left nephrolithiasis.  No hydronephrosis.   Electronically Signed   By: Rolm Baptise M.D.   On: 01/31/2019 20:41 Assessment:     Umbilical hernia, partially reducible, not strangulated based on clinical exam.  With normal white count, normal lactic acid, and a improving symptomatology based on report, no need for emergent repair at this time.  Especially with patient's concern about insurance coverage, along with her large BMI, this will be best treated as a outpatient basis with the robotic assisted laparoscopic approach. Plan:    Discussed the risk of surgery including recurrence, which can be up to 50% in the case of incisional or complex hernias, possible use of prosthetic materials (mesh) and the increased risk of mesh infxn if used, bleeding, chronic pain, post-op infxn, post-op SBO or ileus, and possible re-operation to address said risks. The risks of general anesthetic, if used, includes MI, CVA, sudden death or even reaction to anesthetic medications also discussed. Alternatives include continued observation.  Benefits include possible symptom relief, prevention of incarceration, strangulation, enlargement in size over time, and the risk of emergency surgery in the face of strangulation.  Typical post-op recovery time of 3-5 days with 4-6 weeks of activity restrictions were also discussed.  The patient verbalized understanding and all questions were answered to the patient's satisfaction.  We will have her schedule as soon as she can for robotic assisted  laparoscopic umbilical hernia repair with mesh.  ED return precautions also discussed.   Recommended light duty until surgery given hx of likely partial incarceration of umbilical hernia contents

## 2019-01-31 NOTE — ED Triage Notes (Signed)
FIRST NURSE NOTE:  Pt arrived via POV with reports of lifting something at work last night and feeling a pain in her stomach, pt states there is a "hard ball" present. Pt states the pain was worse last night than today. No distress noted at this time.

## 2019-01-31 NOTE — ED Triage Notes (Signed)
States yesterday was lifting at work and felt popping sensation above umbilicus. Does have palpable lump above umbilicus but is not hard and tolerates palpation.

## 2019-02-02 ENCOUNTER — Ambulatory Visit: Payer: Self-pay | Admitting: Surgery

## 2019-02-02 NOTE — H&P (View-Only) (Signed)
Subjective:   CC: Umbilical hernia  HPI:  Courtney Berger is a 53 y.o. female who was referred by [facility] for evaluation of above cc.   Symptoms were first noted 1 day ago. Pain is sharp, localized around the periumbilical region.  Previous CT reported history of an umbilical hernia but was not noticeable and asymptomatic until yesterday.  Pain worsened after lifting a heavy object she took 1 Percocet at home that night, did feel like pain has been improving since.  The large lump that was palpable there has also become smaller per report..  Associated with nothing specific, exacerbated by palpation lump Is not totally reducible.     Past Medical History:  has a past medical history of Hypertension.  Past Surgical History:       Past Surgical History:  Procedure Laterality Date  . DILATION AND CURETTAGE OF UTERUS    . WRIST SURGERY Right     Family History: family history includes Alzheimer's disease in her paternal grandmother; Angina in her paternal grandfather; Colon cancer in her maternal grandfather; Congestive Heart Failure in her mother; Dementia in her father; Diabetes in her father, maternal grandmother, mother, and paternal grandmother; Hyperlipidemia in her mother; Hypertension in her father and mother; Lung cancer in her paternal grandfather; Parkinson's disease in her father.  Social History:  reports that she has been smoking. She has been smoking about 0.50 packs per day. She has never used smokeless tobacco. She reports that she does not drink alcohol or use drugs.  Current Medications: (Not in a hospital admission)   Allergies:       Allergies as of 01/31/2019 - Review Complete 01/31/2019  Allergen Reaction Noted  . Zyrtec [cetirizine] Itching 12/27/2016    ROS:  General: Denies weight loss, weight gain, fatigue, fevers, chills, and night sweats. Eyes: Denies blurry vision, double vision, eye pain, itchy eyes, and tearing. Ears: Denies hearing loss,  earache, and ringing in ears. Nose: Denies sinus pain, congestion, infections, runny nose, and nosebleeds. Mouth/throat: Denies hoarseness, sore throat, bleeding gums, and difficulty swallowing. Heart: Denies chest pain, palpitations, racing heart, irregular heartbeat, leg pain or swelling, and decreased activity tolerance. Respiratory: Denies breathing difficulty, shortness of breath, wheezing, cough, and sputum. GI: Denies change in appetite, heartburn, nausea, vomiting, constipation, diarrhea, and blood in stool. GU: Denies difficulty urinating, pain with urinating, urgency, frequency, blood in urine. Musculoskeletal: Denies joint stiffness, pain, swelling, muscle weakness. Skin: Denies rash, itching, mass, tumors, sores, and boils Neurologic: Denies headache, fainting, dizziness, seizures, numbness, and tingling. Psychiatric: Denies depression, anxiety, difficulty sleeping, and memory loss. Endocrine: Denies heat or cold intolerance, and increased thirst or urination. Blood/lymph: Denies easy bruising, easy bruising, and swollen glands     Objective:   BP (!) 169/92   Pulse 76   Temp 98.5 F (36.9 C) (Oral)   Resp 20   Ht 5\' 5"  (1.651 m)   Wt 113.4 kg   SpO2 98%   BMI 41.60 kg/m   Constitutional :  alert, cooperative, appears stated age and no distress  Lymphatics/Throat:  no asymmetry, masses, or scars  Respiratory:  clear to auscultation bilaterally  Cardiovascular:  regular rate and rhythm  Gastrointestinal: Soft no guarding.  Umbilical hernia noted.  Tender to palpation, no overlying skin changes, and partially reducible.  Musculoskeletal: l steady gait and movement  Skin: Cool and moist  Psychiatric: Normal affect, non-agitated, not confused       LABS:  CMP Latest Ref Rng & Units 01/31/2019 05/07/2017  12/27/2016  Glucose 70 - 99 mg/dL 87 67 88  BUN 6 - 20 mg/dL 04(V27(H) 13 10  Creatinine 0.44 - 1.00 mg/dL 4.090.96 8.110.81 9.140.81  Sodium 135 - 145 mmol/L 139 142 140   Potassium 3.5 - 5.1 mmol/L 4.0 4.2 3.4(L)  Chloride 98 - 111 mmol/L 102 100 101  CO2 22 - 32 mmol/L 26 26 27   Calcium 8.9 - 10.3 mg/dL 9.4 9.4 7.8(G8.7(L)  Total Protein 6.5 - 8.1 g/dL 8.2(H) 7.4 -  Total Bilirubin 0.3 - 1.2 mg/dL 0.5 0.3 -  Alkaline Phos 38 - 126 U/L 51 66 -  AST 15 - 41 U/L 18 13 -  ALT 0 - 44 U/L 14 9 -   CBC Latest Ref Rng & Units 01/31/2019 05/07/2017 12/27/2016  WBC 4.0 - 10.5 K/uL 9.9 7.5 10.4  Hemoglobin 12.0 - 15.0 g/dL 95.614.6 21.314.7 08.614.8  Hematocrit 36.0 - 46.0 % 42.6 43.6 43.9  Platelets 150 - 400 K/uL 219 236 198    RADS: CLINICAL DATA: Abdominal pain  EXAM: CT ABDOMEN AND PELVIS WITHOUT CONTRAST  TECHNIQUE: Multidetector CT imaging of the abdomen and pelvis was performed following the standard protocol without IV contrast.  COMPARISON: None.  FINDINGS: Lower chest: Lung bases are clear. No effusions. Heart is normal size.  Hepatobiliary: No focal hepatic abnormality. Gallbladder unremarkable.  Pancreas: No focal abnormality or ductal dilatation.  Spleen: No focal abnormality. Normal size.  Adrenals/Urinary Tract: Punctate nonobstructing stones in the left kidney. No hydronephrosis. No renal or adrenal mass. Urinary bladder unremarkable.  Stomach/Bowel: Normal appendix. Stomach, large and small bowel grossly unremarkable.  Vascular/Lymphatic: Aortic atherosclerosis. No enlarged abdominal or pelvic lymph nodes.  Reproductive: Uterus and adnexa unremarkable. No mass.  Other: Umbilical hernia containing fat. There is some stranding within the fat within the hernia sac suggesting possible incarceration/strangulation. Small right femoral hernia containing fat. No free fluid or free air.  Musculoskeletal: No acute bony abnormality. Degenerative facet disease and disc disease in the lower lumbar spine.  IMPRESSION: Umbilical hernia containing fat. There is stranding in the hernia sac suggesting possibility of  incarceration/strangulation.  Aortic atherosclerosis.  Punctate left nephrolithiasis. No hydronephrosis.   Electronically Signed By: Charlett NoseKevin Dover M.D. On: 01/31/2019 20:41 Assessment:     Umbilical hernia, partially reducible, not strangulated based on clinical exam.  With normal white count, normal lactic acid, and a improving symptomatology based on report, no need for emergent repair at this time.  Especially with patient's concern about insurance coverage, along with her large BMI, this will be best treated as a outpatient basis with the robotic assisted laparoscopic approach. Plan:    Discussed the risk of surgery including recurrence, which can be up to 50% in the case of incisional or complex hernias, possible use of prosthetic materials (mesh) and the increased risk of mesh infxn if used, bleeding, chronic pain, post-op infxn, post-op SBO or ileus, and possible re-operation to address said risks. The risks of general anesthetic, if used, includes MI, CVA, sudden death or even reaction to anesthetic medications also discussed. Alternatives include continued observation.  Benefits include possible symptom relief, prevention of incarceration, strangulation, enlargement in size over time, and the risk of emergency surgery in the face of strangulation.  Typical post-op recovery time of 3-5 days with 4-6 weeks of activity restrictions were also discussed.  The patient verbalized understanding and all questions were answered to the patient's satisfaction.  We will have her schedule as soon as she can for robotic assisted laparoscopic umbilical  hernia repair with mesh.  ED return precautions also discussed.   Recommended light duty until surgery given hx of likely partial incarceration of umbilical hernia contents    Electronically signed by Sung Amabile, DO at 01/31/2019 9:54 PM  Electronically signed by Sung Amabile, DO at 01/31/2019 9:56 PM

## 2019-02-02 NOTE — H&P (Signed)
Subjective:   CC: Umbilical hernia  HPI:  Courtney Berger is a 53 y.o. female who was referred by [facility] for evaluation of above cc.   Symptoms were first noted 1 day ago. Pain is sharp, localized around the periumbilical region.  Previous CT reported history of an umbilical hernia but was not noticeable and asymptomatic until yesterday.  Pain worsened after lifting a heavy object she took 1 Percocet at home that night, did feel like pain has been improving since.  The large lump that was palpable there has also become smaller per report..  Associated with nothing specific, exacerbated by palpation lump Is not totally reducible.     Past Medical History:  has a past medical history of Hypertension.  Past Surgical History:       Past Surgical History:  Procedure Laterality Date  . DILATION AND CURETTAGE OF UTERUS    . WRIST SURGERY Right     Family History: family history includes Alzheimer's disease in her paternal grandmother; Angina in her paternal grandfather; Colon cancer in her maternal grandfather; Congestive Heart Failure in her mother; Dementia in her father; Diabetes in her father, maternal grandmother, mother, and paternal grandmother; Hyperlipidemia in her mother; Hypertension in her father and mother; Lung cancer in her paternal grandfather; Parkinson's disease in her father.  Social History:  reports that she has been smoking. She has been smoking about 0.50 packs per day. She has never used smokeless tobacco. She reports that she does not drink alcohol or use drugs.  Current Medications: (Not in a hospital admission)   Allergies:       Allergies as of 01/31/2019 - Review Complete 01/31/2019  Allergen Reaction Noted  . Zyrtec [cetirizine] Itching 12/27/2016    ROS:  General: Denies weight loss, weight gain, fatigue, fevers, chills, and night sweats. Eyes: Denies blurry vision, double vision, eye pain, itchy eyes, and tearing. Ears: Denies hearing loss,  earache, and ringing in ears. Nose: Denies sinus pain, congestion, infections, runny nose, and nosebleeds. Mouth/throat: Denies hoarseness, sore throat, bleeding gums, and difficulty swallowing. Heart: Denies chest pain, palpitations, racing heart, irregular heartbeat, leg pain or swelling, and decreased activity tolerance. Respiratory: Denies breathing difficulty, shortness of breath, wheezing, cough, and sputum. GI: Denies change in appetite, heartburn, nausea, vomiting, constipation, diarrhea, and blood in stool. GU: Denies difficulty urinating, pain with urinating, urgency, frequency, blood in urine. Musculoskeletal: Denies joint stiffness, pain, swelling, muscle weakness. Skin: Denies rash, itching, mass, tumors, sores, and boils Neurologic: Denies headache, fainting, dizziness, seizures, numbness, and tingling. Psychiatric: Denies depression, anxiety, difficulty sleeping, and memory loss. Endocrine: Denies heat or cold intolerance, and increased thirst or urination. Blood/lymph: Denies easy bruising, easy bruising, and swollen glands     Objective:   BP (!) 169/92   Pulse 76   Temp 98.5 F (36.9 C) (Oral)   Resp 20   Ht 5\' 5"  (1.651 m)   Wt 113.4 kg   SpO2 98%   BMI 41.60 kg/m   Constitutional :  alert, cooperative, appears stated age and no distress  Lymphatics/Throat:  no asymmetry, masses, or scars  Respiratory:  clear to auscultation bilaterally  Cardiovascular:  regular rate and rhythm  Gastrointestinal: Soft no guarding.  Umbilical hernia noted.  Tender to palpation, no overlying skin changes, and partially reducible.  Musculoskeletal: l steady gait and movement  Skin: Cool and moist  Psychiatric: Normal affect, non-agitated, not confused       LABS:  CMP Latest Ref Rng & Units 01/31/2019 05/07/2017  12/27/2016  Glucose 70 - 99 mg/dL 87 67 88  BUN 6 - 20 mg/dL 27(H) 13 10  Creatinine 0.44 - 1.00 mg/dL 0.96 0.81 0.81  Sodium 135 - 145 mmol/L 139 142 140   Potassium 3.5 - 5.1 mmol/L 4.0 4.2 3.4(L)  Chloride 98 - 111 mmol/L 102 100 101  CO2 22 - 32 mmol/L 26 26 27  Calcium 8.9 - 10.3 mg/dL 9.4 9.4 8.7(L)  Total Protein 6.5 - 8.1 g/dL 8.2(H) 7.4 -  Total Bilirubin 0.3 - 1.2 mg/dL 0.5 0.3 -  Alkaline Phos 38 - 126 U/L 51 66 -  AST 15 - 41 U/L 18 13 -  ALT 0 - 44 U/L 14 9 -   CBC Latest Ref Rng & Units 01/31/2019 05/07/2017 12/27/2016  WBC 4.0 - 10.5 K/uL 9.9 7.5 10.4  Hemoglobin 12.0 - 15.0 g/dL 14.6 14.7 14.8  Hematocrit 36.0 - 46.0 % 42.6 43.6 43.9  Platelets 150 - 400 K/uL 219 236 198    RADS: CLINICAL DATA: Abdominal pain  EXAM: CT ABDOMEN AND PELVIS WITHOUT CONTRAST  TECHNIQUE: Multidetector CT imaging of the abdomen and pelvis was performed following the standard protocol without IV contrast.  COMPARISON: None.  FINDINGS: Lower chest: Lung bases are clear. No effusions. Heart is normal size.  Hepatobiliary: No focal hepatic abnormality. Gallbladder unremarkable.  Pancreas: No focal abnormality or ductal dilatation.  Spleen: No focal abnormality. Normal size.  Adrenals/Urinary Tract: Punctate nonobstructing stones in the left kidney. No hydronephrosis. No renal or adrenal mass. Urinary bladder unremarkable.  Stomach/Bowel: Normal appendix. Stomach, large and small bowel grossly unremarkable.  Vascular/Lymphatic: Aortic atherosclerosis. No enlarged abdominal or pelvic lymph nodes.  Reproductive: Uterus and adnexa unremarkable. No mass.  Other: Umbilical hernia containing fat. There is some stranding within the fat within the hernia sac suggesting possible incarceration/strangulation. Small right femoral hernia containing fat. No free fluid or free air.  Musculoskeletal: No acute bony abnormality. Degenerative facet disease and disc disease in the lower lumbar spine.  IMPRESSION: Umbilical hernia containing fat. There is stranding in the hernia sac suggesting possibility of  incarceration/strangulation.  Aortic atherosclerosis.  Punctate left nephrolithiasis. No hydronephrosis.   Electronically Signed By: Kevin Dover M.D. On: 01/31/2019 20:41 Assessment:     Umbilical hernia, partially reducible, not strangulated based on clinical exam.  With normal white count, normal lactic acid, and a improving symptomatology based on report, no need for emergent repair at this time.  Especially with patient's concern about insurance coverage, along with her large BMI, this will be best treated as a outpatient basis with the robotic assisted laparoscopic approach. Plan:    Discussed the risk of surgery including recurrence, which can be up to 50% in the case of incisional or complex hernias, possible use of prosthetic materials (mesh) and the increased risk of mesh infxn if used, bleeding, chronic pain, post-op infxn, post-op SBO or ileus, and possible re-operation to address said risks. The risks of general anesthetic, if used, includes MI, CVA, sudden death or even reaction to anesthetic medications also discussed. Alternatives include continued observation.  Benefits include possible symptom relief, prevention of incarceration, strangulation, enlargement in size over time, and the risk of emergency surgery in the face of strangulation.  Typical post-op recovery time of 3-5 days with 4-6 weeks of activity restrictions were also discussed.  The patient verbalized understanding and all questions were answered to the patient's satisfaction.  We will have her schedule as soon as she can for robotic assisted laparoscopic umbilical   hernia repair with mesh.  ED return precautions also discussed.   Recommended light duty until surgery given hx of likely partial incarceration of umbilical hernia contents    Electronically signed by Sung Amabile, DO at 01/31/2019 9:54 PM  Electronically signed by Sung Amabile, DO at 01/31/2019 9:56 PM

## 2019-02-04 ENCOUNTER — Encounter
Admission: RE | Admit: 2019-02-04 | Discharge: 2019-02-04 | Disposition: A | Discharge status: Home / Self Care | Payer: Medicaid Other | Source: Ambulatory Visit | Attending: Surgery | Admitting: Surgery

## 2019-02-04 ENCOUNTER — Other Ambulatory Visit: Payer: Self-pay

## 2019-02-04 DIAGNOSIS — Z20828 Contact with and (suspected) exposure to other viral communicable diseases: Secondary | ICD-10-CM | POA: Diagnosis not present

## 2019-02-04 DIAGNOSIS — Z01818 Encounter for other preprocedural examination: Secondary | ICD-10-CM | POA: Insufficient documentation

## 2019-02-04 DIAGNOSIS — I10 Essential (primary) hypertension: Secondary | ICD-10-CM | POA: Insufficient documentation

## 2019-02-04 DIAGNOSIS — R001 Bradycardia, unspecified: Secondary | ICD-10-CM | POA: Insufficient documentation

## 2019-02-04 HISTORY — DX: Umbilical hernia without obstruction or gangrene: K42.9

## 2019-02-04 HISTORY — DX: Cardiac murmur, unspecified: R01.1

## 2019-02-04 HISTORY — DX: Unspecified asthma, uncomplicated: J45.909

## 2019-02-04 LAB — SARS CORONAVIRUS 2 (TAT 6-24 HRS): SARS Coronavirus 2: NEGATIVE

## 2019-02-04 NOTE — Patient Instructions (Signed)
Your procedure is scheduled on: February 06, 2019 Friday  Report to Day Surgery on the 2nd floor of the [facility]. To find out your arrival time, please call [phone] between 1PM - 3PM on: Thursday February 05, 2019  REMEMBER: Instructions that are not followed completely may result in serious medical risk, up to and including death; or upon the discretion of your surgeon and anesthesiologist your surgery may need to be rescheduled.  Do not eat food after midnight the night before surgery.  No gum chewing, lozengers or hard candies.  You may however, drink CLEAR liquids up to 2 hours before you are scheduled to arrive for your surgery. Do not drink anything within 2 hours of the start of your surgery.  Clear liquids include: - water  - apple juice without pulp - CLEAR gatorade - black coffee or tea (Do NOT add milk or creamers to the coffee or tea) Do NOT drink anything that is not on this list.  Type 1 and Type 2 diabetics should only drink water.  No Alcohol for 24 hours before or after surgery.  No Smoking including e-cigarettes for 24 hours prior to surgery.  No chewable tobacco products for at least 6 hours prior to surgery.  No nicotine patches on the day of surgery.  On the morning of surgery brush your teeth with toothpaste and water, you may rinse your mouth with mouthwash if you wish. Do not swallow any toothpaste or mouthwash.  Notify your doctor if there is any change in your medical condition (cold, fever, infection).  Do not wear jewelry, make-up, hairpins, clips or nail polish.  Do not wear lotions, powders, or perfumes.   Do not shave 48 hours prior to surgery.   Contacts and dentures may not be worn into surgery.  Do not bring valuables to the hospital, including drivers license, insurance or credit cards.  [facility] is not responsible for any belongings or valuables.   TAKE THESE MEDICATIONS THE MORNING OF SURGERY: METOPROLOL AMLODIPINE  Use  CHG Soap  as directed on instruction sheet.  Use inhalers on the day of surgery   Follow recommendations from Cardiologist, Pulmonologist or PCP regarding stopping Aspirin, Coumadin, Plavix, Eliquis, Pradaxa, or Pletal.  Stop Anti-inflammatories (NSAIDS) such as Advil, Aleve, Ibuprofen, Motrin, Naproxen, Naprosyn and Aspirin based products such as Excedrin, Goodys Powder, BC Powder. (May take Tylenol or Acetaminophen if needed.)  Stop ANY OVER THE COUNTER supplements until after surgery. (May continue Vitamin D, Vitamin B, and multivitamin.)  Wear comfortable clothing (specific to your surgery type) to the hospital.  Plan for stool softeners for home use.  If you are being discharged the day of surgery, you will not be allowed to drive home. You will need a responsible adult to drive you home and stay with you that night.   If you are taking public transportation, you will need to have a responsible adult with you. Please confirm with your physician that it is acceptable to use public transportation.   Please call [phone] if you have any questions about these instructions.

## 2019-02-06 ENCOUNTER — Encounter
Admission: RE | Disposition: A | Discharge status: Home / Self Care | Payer: Self-pay | Source: Home / Self Care | Attending: Surgery

## 2019-02-06 ENCOUNTER — Ambulatory Visit: Payer: Medicaid Other | Admitting: Anesthesiology

## 2019-02-06 ENCOUNTER — Ambulatory Visit
Admission: RE | Admit: 2019-02-06 | Discharge: 2019-02-06 | Disposition: A | Discharge status: Home / Self Care | Payer: Medicaid Other | Attending: Surgery | Admitting: Surgery

## 2019-02-06 ENCOUNTER — Encounter: Payer: Self-pay | Admitting: *Deleted

## 2019-02-06 ENCOUNTER — Other Ambulatory Visit: Payer: Self-pay

## 2019-02-06 DIAGNOSIS — Z888 Allergy status to other drugs, medicaments and biological substances status: Secondary | ICD-10-CM | POA: Diagnosis not present

## 2019-02-06 DIAGNOSIS — F17210 Nicotine dependence, cigarettes, uncomplicated: Secondary | ICD-10-CM | POA: Insufficient documentation

## 2019-02-06 DIAGNOSIS — Z8249 Family history of ischemic heart disease and other diseases of the circulatory system: Secondary | ICD-10-CM | POA: Insufficient documentation

## 2019-02-06 DIAGNOSIS — Z6841 Body Mass Index (BMI) 40.0 and over, adult: Secondary | ICD-10-CM | POA: Insufficient documentation

## 2019-02-06 DIAGNOSIS — R011 Cardiac murmur, unspecified: Secondary | ICD-10-CM | POA: Diagnosis not present

## 2019-02-06 DIAGNOSIS — I700 Atherosclerosis of aorta: Secondary | ICD-10-CM | POA: Insufficient documentation

## 2019-02-06 DIAGNOSIS — N200 Calculus of kidney: Secondary | ICD-10-CM | POA: Insufficient documentation

## 2019-02-06 DIAGNOSIS — J45909 Unspecified asthma, uncomplicated: Secondary | ICD-10-CM | POA: Insufficient documentation

## 2019-02-06 DIAGNOSIS — K436 Other and unspecified ventral hernia with obstruction, without gangrene: Secondary | ICD-10-CM | POA: Insufficient documentation

## 2019-02-06 DIAGNOSIS — I10 Essential (primary) hypertension: Secondary | ICD-10-CM | POA: Diagnosis not present

## 2019-02-06 DIAGNOSIS — Z79899 Other long term (current) drug therapy: Secondary | ICD-10-CM | POA: Diagnosis not present

## 2019-02-06 DIAGNOSIS — K439 Ventral hernia without obstruction or gangrene: Secondary | ICD-10-CM

## 2019-02-06 SURGERY — REPAIR, HERNIA, UMBILICAL, ROBOT-ASSISTED
Anesthesia: General

## 2019-02-06 MED ORDER — FAMOTIDINE 20 MG PO TABS
20.0000 mg | ORAL_TABLET | Freq: Once | ORAL | Status: AC
  Administered 2019-02-06: 12:00:00 20 mg via ORAL

## 2019-02-06 MED ORDER — PHENYLEPHRINE HCL (PRESSORS) 10 MG/ML IV SOLN
INTRAVENOUS | Status: DC | PRN
  Administered 2019-02-06 (×3): 100 ug via INTRAVENOUS

## 2019-02-06 MED ORDER — GLYCOPYRROLATE 0.2 MG/ML IJ SOLN
INTRAMUSCULAR | Status: DC | PRN
  Administered 2019-02-06 (×2): 0.2 mg via INTRAVENOUS

## 2019-02-06 MED ORDER — ROCURONIUM BROMIDE 100 MG/10ML IV SOLN
INTRAVENOUS | Status: DC | PRN
  Administered 2019-02-06: 10 mg via INTRAVENOUS
  Administered 2019-02-06: 50 mg via INTRAVENOUS
  Administered 2019-02-06 (×3): 20 mg via INTRAVENOUS

## 2019-02-06 MED ORDER — ONDANSETRON HCL 4 MG/2ML IJ SOLN
INTRAMUSCULAR | Status: DC | PRN
  Administered 2019-02-06: 4 mg via INTRAVENOUS

## 2019-02-06 MED ORDER — CELECOXIB 200 MG PO CAPS
ORAL_CAPSULE | ORAL | Status: AC
  Administered 2019-02-06: 200 mg via ORAL
  Filled 2019-02-06: qty 1

## 2019-02-06 MED ORDER — BUPIVACAINE LIPOSOME 1.3 % IJ SUSP
INTRAMUSCULAR | Status: AC
  Filled 2019-02-06: qty 20

## 2019-02-06 MED ORDER — HYDROCODONE-ACETAMINOPHEN 5-325 MG PO TABS
ORAL_TABLET | ORAL | Status: AC
  Filled 2019-02-06: qty 1

## 2019-02-06 MED ORDER — CEFAZOLIN SODIUM-DEXTROSE 2-4 GM/100ML-% IV SOLN
2.0000 g | INTRAVENOUS | Status: AC
  Administered 2019-02-06: 13:00:00 2 g via INTRAVENOUS

## 2019-02-06 MED ORDER — CEFAZOLIN SODIUM-DEXTROSE 2-4 GM/100ML-% IV SOLN
INTRAVENOUS | Status: AC
  Filled 2019-02-06: qty 100

## 2019-02-06 MED ORDER — GABAPENTIN 300 MG PO CAPS
300.0000 mg | ORAL_CAPSULE | ORAL | Status: AC
  Administered 2019-02-06: 12:00:00 300 mg via ORAL

## 2019-02-06 MED ORDER — GABAPENTIN 300 MG PO CAPS
ORAL_CAPSULE | ORAL | Status: AC
  Administered 2019-02-06: 300 mg via ORAL
  Filled 2019-02-06: qty 1

## 2019-02-06 MED ORDER — SUGAMMADEX SODIUM 500 MG/5ML IV SOLN
INTRAVENOUS | Status: DC | PRN
  Administered 2019-02-06: 466.4 mg via INTRAVENOUS

## 2019-02-06 MED ORDER — SODIUM CHLORIDE (PF) 0.9 % IJ SOLN
INTRAMUSCULAR | Status: AC
  Filled 2019-02-06: qty 50

## 2019-02-06 MED ORDER — ONDANSETRON HCL 4 MG/2ML IJ SOLN: 4.0000 mg | Freq: Once | INTRAMUSCULAR | Status: DC | PRN

## 2019-02-06 MED ORDER — ACETAMINOPHEN 500 MG PO TABS
ORAL_TABLET | ORAL | Status: AC
  Administered 2019-02-06: 1000 mg via ORAL
  Filled 2019-02-06: qty 2

## 2019-02-06 MED ORDER — LACTATED RINGERS IV SOLN
INTRAVENOUS | Status: DC
  Administered 2019-02-06: 12:00:00 via INTRAVENOUS

## 2019-02-06 MED ORDER — ACETAMINOPHEN 325 MG PO TABS: 650.0000 mg | ORAL_TABLET | Freq: Three times a day (TID) | ORAL | 0 refills | Status: AC | PRN

## 2019-02-06 MED ORDER — MIDAZOLAM HCL 2 MG/2ML IJ SOLN
INTRAMUSCULAR | Status: DC | PRN
  Administered 2019-02-06: 2 mg via INTRAVENOUS

## 2019-02-06 MED ORDER — DOCUSATE SODIUM 100 MG PO CAPS: 100.0000 mg | ORAL_CAPSULE | Freq: Two times a day (BID) | ORAL | 0 refills | Status: AC | PRN

## 2019-02-06 MED ORDER — FAMOTIDINE 20 MG PO TABS
ORAL_TABLET | ORAL | Status: AC
  Administered 2019-02-06: 20 mg via ORAL
  Filled 2019-02-06: qty 1

## 2019-02-06 MED ORDER — HYDROCODONE-ACETAMINOPHEN 5-325 MG PO TABS: 1.0000 | ORAL_TABLET | Freq: Four times a day (QID) | ORAL | 0 refills | Status: DC | PRN

## 2019-02-06 MED ORDER — FENTANYL CITRATE (PF) 100 MCG/2ML IJ SOLN
25.0000 ug | INTRAMUSCULAR | Status: AC | PRN
  Administered 2019-02-06 (×2): 25 ug via INTRAVENOUS
  Administered 2019-02-06: 50 ug via INTRAVENOUS
  Administered 2019-02-06 (×4): 25 ug via INTRAVENOUS

## 2019-02-06 MED ORDER — BUPIVACAINE HCL (PF) 0.5 % IJ SOLN
INTRAMUSCULAR | Status: AC
  Filled 2019-02-06: qty 30

## 2019-02-06 MED ORDER — HYDROCODONE-ACETAMINOPHEN 5-325 MG PO TABS
1.0000 | ORAL_TABLET | Freq: Four times a day (QID) | ORAL | Status: DC | PRN
  Administered 2019-02-06: 1 via ORAL

## 2019-02-06 MED ORDER — FENTANYL CITRATE (PF) 100 MCG/2ML IJ SOLN
INTRAMUSCULAR | Status: AC
  Administered 2019-02-06: 25 ug via INTRAVENOUS
  Filled 2019-02-06: qty 2

## 2019-02-06 MED ORDER — SODIUM CHLORIDE 0.9 % IV SOLN
INTRAVENOUS | Status: DC | PRN
  Administered 2019-02-06: 70 mL

## 2019-02-06 MED ORDER — BUPIVACAINE-EPINEPHRINE 0.5% -1:200000 IJ SOLN
INTRAMUSCULAR | Status: DC | PRN
  Administered 2019-02-06: 30 mL

## 2019-02-06 MED ORDER — EPINEPHRINE PF 1 MG/ML IJ SOLN
INTRAMUSCULAR | Status: AC
  Filled 2019-02-06: qty 1

## 2019-02-06 MED ORDER — EPHEDRINE SULFATE 50 MG/ML IJ SOLN
INTRAMUSCULAR | Status: DC | PRN
  Administered 2019-02-06: 10 mg via INTRAVENOUS

## 2019-02-06 MED ORDER — IBUPROFEN 800 MG PO TABS: 800.0000 mg | ORAL_TABLET | Freq: Three times a day (TID) | ORAL | 0 refills | Status: DC | PRN

## 2019-02-06 MED ORDER — FENTANYL CITRATE (PF) 100 MCG/2ML IJ SOLN
INTRAMUSCULAR | Status: DC | PRN
  Administered 2019-02-06 (×5): 50 ug via INTRAVENOUS

## 2019-02-06 MED ORDER — DEXAMETHASONE SODIUM PHOSPHATE 10 MG/ML IJ SOLN
INTRAMUSCULAR | Status: DC | PRN
  Administered 2019-02-06: 10 mg via INTRAVENOUS

## 2019-02-06 MED ORDER — CELECOXIB 200 MG PO CAPS
200.0000 mg | ORAL_CAPSULE | ORAL | Status: AC
  Administered 2019-02-06: 12:00:00 200 mg via ORAL

## 2019-02-06 MED ORDER — CHLORHEXIDINE GLUCONATE CLOTH 2 % EX PADS: 6.0000 | MEDICATED_PAD | Freq: Once | CUTANEOUS | Status: DC

## 2019-02-06 MED ORDER — PROPOFOL 10 MG/ML IV BOLUS
INTRAVENOUS | Status: DC | PRN
  Administered 2019-02-06: 200 mg via INTRAVENOUS

## 2019-02-06 MED ORDER — PROPOFOL 10 MG/ML IV BOLUS
INTRAVENOUS | Status: AC
  Filled 2019-02-06: qty 20

## 2019-02-06 MED ORDER — LACTATED RINGERS IV SOLN
INTRAVENOUS | Status: DC | PRN
  Administered 2019-02-06 (×2): via INTRAVENOUS

## 2019-02-06 MED ORDER — FENTANYL CITRATE (PF) 250 MCG/5ML IJ SOLN
INTRAMUSCULAR | Status: AC
  Filled 2019-02-06: qty 5

## 2019-02-06 MED ORDER — LIDOCAINE HCL (CARDIAC) PF 100 MG/5ML IV SOSY
PREFILLED_SYRINGE | INTRAVENOUS | Status: DC | PRN
  Administered 2019-02-06: 100 mg via INTRAVENOUS

## 2019-02-06 MED ORDER — ACETAMINOPHEN 500 MG PO TABS
1000.0000 mg | ORAL_TABLET | ORAL | Status: AC
  Administered 2019-02-06: 12:00:00 1000 mg via ORAL

## 2019-02-06 MED ORDER — MIDAZOLAM HCL 2 MG/2ML IJ SOLN
INTRAMUSCULAR | Status: AC
  Filled 2019-02-06: qty 2

## 2019-02-06 SURGICAL SUPPLY — 58 items
BLADE SURG SZ11 CARB STEEL (BLADE) ×3 IMPLANT
CANISTER SUCT 1200ML W/VALVE (MISCELLANEOUS) ×3 IMPLANT
CANNULA REDUC XI 12-8 STAPL (CANNULA) ×1
CANNULA REDUC XI 12-8MM STAPL (CANNULA) ×1
CANNULA REDUCER 12-8 DVNC XI (CANNULA) ×1 IMPLANT
CHLORAPREP W/TINT 26 (MISCELLANEOUS) ×3 IMPLANT
COVER TIP SHEARS 8 DVNC (MISCELLANEOUS) ×1 IMPLANT
COVER TIP SHEARS 8MM DA VINCI (MISCELLANEOUS) ×2
COVER WAND RF STERILE (DRAPES) ×3 IMPLANT
DEFOGGER SCOPE WARMER CLEARIFY (MISCELLANEOUS) ×3 IMPLANT
DERMABOND ADVANCED (GAUZE/BANDAGES/DRESSINGS) ×2
DERMABOND ADVANCED .7 DNX12 (GAUZE/BANDAGES/DRESSINGS) ×1 IMPLANT
DRAPE 3/4 80X56 (DRAPES) ×3 IMPLANT
DRAPE ARM DVNC X/XI (DISPOSABLE) ×4 IMPLANT
DRAPE COLUMN DVNC XI (DISPOSABLE) ×1 IMPLANT
DRAPE DA VINCI XI ARM (DISPOSABLE) ×8
DRAPE DA VINCI XI COLUMN (DISPOSABLE) ×2
ELECT CAUTERY BLADE 6.4 (BLADE) ×3 IMPLANT
ELECT REM PT RETURN 9FT ADLT (ELECTROSURGICAL) ×3
ELECTRODE REM PT RTRN 9FT ADLT (ELECTROSURGICAL) ×1 IMPLANT
ETHIBOND 2 0 GREEN CT 2 30IN (SUTURE) ×6 IMPLANT
GLOVE BIOGEL PI IND STRL 7.0 (GLOVE) ×1 IMPLANT
GLOVE BIOGEL PI INDICATOR 7.0 (GLOVE) ×2
GLOVE SURG SYN 6.5 ES PF (GLOVE) ×3 IMPLANT
GOWN STRL REUS W/ TWL LRG LVL3 (GOWN DISPOSABLE) ×3 IMPLANT
GOWN STRL REUS W/TWL LRG LVL3 (GOWN DISPOSABLE) ×6
GRASPER SUT TROCAR 14GX15 (MISCELLANEOUS) ×3 IMPLANT
IRRIGATOR SUCT 8 DISP DVNC XI (IRRIGATION / IRRIGATOR) IMPLANT
IRRIGATOR SUCTION 8MM XI DISP (IRRIGATION / IRRIGATOR)
IV NS 1000ML (IV SOLUTION)
IV NS 1000ML BAXH (IV SOLUTION) IMPLANT
KIT PINK PAD W/HEAD ARE REST (MISCELLANEOUS) ×3
KIT PINK PAD W/HEAD ARM REST (MISCELLANEOUS) ×1 IMPLANT
LABEL OR SOLS (LABEL) ×3 IMPLANT
MESH VENT LT ST 11.4CM CRL (Mesh General) ×3 IMPLANT
NEEDLE HYPO 22GX1.5 SAFETY (NEEDLE) ×3 IMPLANT
NEEDLE INSUFFLATION 14GA 120MM (NEEDLE) ×3 IMPLANT
OBTURATOR OPTICAL STANDARD 8MM (TROCAR) ×2
OBTURATOR OPTICAL STND 8 DVNC (TROCAR) ×1
OBTURATOR OPTICALSTD 8 DVNC (TROCAR) ×1 IMPLANT
PACK LAP CHOLECYSTECTOMY (MISCELLANEOUS) ×3 IMPLANT
PENCIL ELECTRO HAND CTR (MISCELLANEOUS) ×3 IMPLANT
SEAL CANN UNIV 5-8 DVNC XI (MISCELLANEOUS) ×2 IMPLANT
SEAL XI 5MM-8MM UNIVERSAL (MISCELLANEOUS) ×4
SOLUTION ELECTROLUBE (MISCELLANEOUS) ×3 IMPLANT
STAPLER CANNULA SEAL DVNC XI (STAPLE) ×1 IMPLANT
STAPLER CANNULA SEAL XI (STAPLE) ×2
SUT DVC VLOC 3-0 CL 6 P-12 (SUTURE) ×6 IMPLANT
SUT MNCRL AB 4-0 PS2 18 (SUTURE) ×3 IMPLANT
SUT VIC AB 3-0 SH 27 (SUTURE) ×2
SUT VIC AB 3-0 SH 27X BRD (SUTURE) ×1 IMPLANT
SUT VICRYL 0 AB UR-6 (SUTURE) ×6 IMPLANT
SYR 30ML LL (SYRINGE) ×3 IMPLANT
SYSTEM WECK SHIELD CLOSURE (TROCAR) ×3 IMPLANT
TRAY FOLEY MTR SLVR 16FR STAT (SET/KITS/TRAYS/PACK) ×3 IMPLANT
TROCAR XCEL 12X100 BLDLESS (ENDOMECHANICALS) ×3 IMPLANT
TROCAR XCEL NON-BLD 5MMX100MML (ENDOMECHANICALS) ×3 IMPLANT
TUBING EVAC SMOKE HEATED PNEUM (TUBING) ×3 IMPLANT

## 2019-02-06 NOTE — Anesthesia Post-op Follow-up Note (Signed)
Anesthesia QCDR form completed.        

## 2019-02-06 NOTE — Interval H&P Note (Signed)
History and Physical Interval Note:  02/06/2019 12:09 PM  Courtney Berger  has presented today for surgery, with the diagnosis of M14.7 Umbilical hernia without obstruction and without gangrene.  The various methods of treatment have been discussed with the patient and family. After consideration of risks, benefits and other options for treatment, the patient has consented to  Procedure(s): XI [place] (N/A) as a surgical intervention.  The patient's history has been reviewed, patient examined, no change in status, stable for surgery.  I have reviewed the patient's chart and labs.  Questions were answered to the patient's satisfaction.     Elise Knobloch Lysle Pearl

## 2019-02-06 NOTE — Op Note (Signed)
Preoperative diagnosis: ventral hernia, incarcerated  postoperative diagnosis: same  Procedure: Robotic assisted laparoscopic ventral hernia repair with mesh  Anesthesia: general  Surgeon: Benjamine Sprague  Wound Classification: Clean  Specimen: none  Complications: None  Estimated Blood Loss: 67ml  Indications:see HPI  Findings: 1. Incarcerated ventral hernia 4. Tension free repair achieved with ventral light ST 11.5 cm bard mesh and suture 5. Adequate hemostasis  Description of procedure: The patient was brought to the operating room and general anesthesia was induced. A time-out was completed verifying correct patient, procedure, site, positioning, and implant(s) and/or special equipment prior to beginning this procedure. Antibiotics were administered prior to making the incision. SCDs placed. The anterior abdominal wall was prepped and draped in the standard sterile fashion.   Palmer's point chosen for entry.  Veress needle placed and abdomen insufflated to 15cm without any dramatic increase in pressure.  Needle removed and optiview technique used to place 68mm port at same point.  No injury noted during placement.  The ventral hernia was unable to be visualized from the standpoint so an additional 8 mm port was placed in the infraumbilical region.  This allowed visualization of the ventral hernia hidden within the falciform ligament base.  Exparel was infused in a TAP block. 3 additional ports, 36mm x3 along right lateral aspect placed.  5 mm Optiview port was then replaced with a 12 mm port to facilitate placement of the mesh and sutures.  Xi robot then docked into place.  Hernia contents noted and reduced with combination of blunt, sharp dissection with scissors and fenestrated forceps.  Hemostasis achieved throughout this portion.  Once all hernia contents reduced, there was noted to be a ventral hernia, measuring approximately 2-1/2 cm..    Insufflation dropped to 43mm and transfacial  suture with 0 stratafix used to primarily close defect under minimal tension. Bard echo plus protected 11.5 cm mesh was placed within the abdominal cavity through 29mm port and secured to the abdominal wall using the positioning system.  The mesh was then circumferentially sutured into the anterior abdominal wall using 3-0 VLock x2.  Any bleeding noted during this portion was no longer actively bleeding by end of securing mesh and tightening the suture.  All needles removed under direct visualization.  Robot was undocked.  The 87mm cannula was removed and port site was closed using Efx Shield with 0 vicryl, ensuring no bowels were injured during this process.  Abdomen then desufflated while camera within abdomen to ensure no signs of new bleed prior to removing camera and rest of ports completely.  12 mm port site skin closed  All skin incisions closed with runninrg 4-0 Monocryl in a subcuticular fashion.  All wounds then dressed with Dermabond.  Patient was then successfully awakened and transferred to PACU in stable condition.  At the end of the procedure sponge and instrument counts were correct.

## 2019-02-06 NOTE — Anesthesia Preprocedure Evaluation (Addendum)
Anesthesia Evaluation  Patient identified by MRN, date of birth, ID band Patient awake    Reviewed: Allergy & Precautions, NPO status , Patient's Chart, lab work & pertinent test results, reviewed documented beta blocker date and time   Airway Mallampati: II  TM Distance: >3 FB     Dental  (+) Poor Dentition, Chipped   Pulmonary asthma , Current Smoker,    Pulmonary exam normal        Cardiovascular hypertension, Pt. on medications and Pt. on home beta blockers Normal cardiovascular exam+ Valvular Problems/Murmurs      Neuro/Psych negative neurological ROS  negative psych ROS   GI/Hepatic Neg liver ROS,   Endo/Other  Morbid obesity  Renal/GU negative Renal ROS  negative genitourinary   Musculoskeletal negative musculoskeletal ROS (+)   Abdominal Normal abdominal exam  (+)   Peds negative pediatric ROS (+)  Hematology negative hematology ROS (+)   Anesthesia Other Findings   Reproductive/Obstetrics                             Anesthesia Physical Anesthesia Plan  ASA: III  Anesthesia Plan: General   Post-op Pain Management:    Induction: Intravenous  PONV Risk Score and Plan:   Airway Management Planned: Oral ETT  Additional Equipment:   Intra-op Plan:   Post-operative Plan: Extubation in OR  Informed Consent: I have reviewed the patients History and Physical, chart, labs and discussed the procedure including the risks, benefits and alternatives for the proposed anesthesia with the patient or authorized representative who has indicated his/her understanding and acceptance.     Dental advisory given  Plan Discussed with: CRNA and Surgeon  Anesthesia Plan Comments:        Anesthesia Quick Evaluation

## 2019-02-06 NOTE — Discharge Instructions (Addendum)
Hernia repair, Care After This sheet gives you information about how to care for yourself after your procedure. Your health care provider may also give you more specific instructions. If you have problems or questions, contact your health care provider. What can I expect after the procedure? After your procedure, it is common to have the following:  Pain in your abdomen, especially in the incision areas. You will be given medicine to control the pain.  Tiredness. This is a normal part of the recovery process. Your energy level will return to normal over the next several weeks.  Changes in your bowel movements, such as constipation or needing to go more often. Talk with your health care provider about how to manage this. Follow these instructions at home: Medicines   tylenol and advil as needed for discomfort.  Please alternate between the two every four hours as needed for pain.     Use narcotics, if prescribed, only when tylenol and motrin is not enough to control pain.   325-650mg every 8hrs to max of 3000mg/24hrs (including the 325mg in every norco dose) for the tylenol.     Advil up to 800mg per dose every 8hrs as needed for pain.    PLEASE RECORD NUMBER OF PILLS TAKEN UNTIL NEXT FOLLOW UP APPT.  THIS WILL HELP DETERMINE HOW READY YOU ARE TO BE RELEASED FROM ANY ACTIVITY RESTRICTIONS  Do not drive or use heavy machinery while taking prescription pain medicine.  Do not drink alcohol while taking prescription pain medicine.  Incision care     Follow instructions from your health care provider about how to take care of your incision areas. Make sure you: ? Keep your incisions clean and dry. ? Wash your hands with soap and water before and after applying medicine to the areas, and before and after changing your bandage (dressing). If soap and water are not available, use hand sanitizer. ? Change your dressing as told by your health care provider. ? Leave stitches (sutures), skin  glue, or adhesive strips in place. These skin closures may need to stay in place for 2 weeks or longer. If adhesive strip edges start to loosen and curl up, you may trim the loose edges. Do not remove adhesive strips completely unless your health care provider tells you to do that.  Do not wear tight clothing over the incisions. Tight clothing may rub and irritate the incision areas, which may cause the incisions to open.  Do not take baths, swim, or use a hot tub until your health care provider approves. OK TO SHOWER IN 24HRS.    Check your incision area every day for signs of infection. Check for: ? More redness, swelling, or pain. ? More fluid or blood. ? Warmth. ? Pus or a bad smell. Activity  Avoid lifting anything that is heavier than 10 lb (4.5 kg) for 2 weeks or until your health care provider says it is okay.  No pushing/pulling greater than 30lbs  You may resume normal activities as told by your health care provider. Ask your health care provider what activities are safe for you.  Take rest breaks during the day as needed. Eating and drinking  Follow instructions from your health care provider about what you can eat after surgery.  To prevent or treat constipation while you are taking prescription pain medicine, your health care provider may recommend that you: ? Drink enough fluid to keep your urine clear or pale yellow. ? Take over-the-counter or prescription medicines. ?   Eat foods that are high in fiber, such as fresh fruits and vegetables, whole grains, and beans. ? Limit foods that are high in fat and processed sugars, such as fried and sweet foods. General instructions  Ask your health care provider when you will need an appointment to get your sutures or staples removed.  Keep all follow-up visits as told by your health care provider. This is important. Contact a health care provider if:  You have more redness, swelling, or pain around your incisions.  You have  more fluid or blood coming from the incisions.  Your incisions feel warm to the touch.  You have pus or a bad smell coming from your incisions or your dressing.  You have a fever.  You have an incision that breaks open (edges not staying together) after sutures or staples have been removed. Get help right away if:  You develop a rash.  You have chest pain or difficulty breathing.  You have pain or swelling in your legs.  You feel light-headed or you faint.  Your abdomen swells (becomes distended).  You have nausea or vomiting.  You have blood in your stool (feces). This information is not intended to replace advice given to you by your health care provider. Make sure you discuss any questions you have with your health care provider. Document Released: 09/08/2004 Document Revised: 11/08/2017 Document Reviewed: 11/21/2015 Elsevier Interactive Patient Education  2019 [employer]    AMBULATORY SURGERY  DISCHARGE INSTRUCTIONS   1) The drugs that you were given will stay in your system until tomorrow so for the next 24 hours you should not:  A) Drive an automobile B) Make any legal decisions C) Drink any alcoholic beverage   2) You may resume regular meals tomorrow.  Today it is better to start with liquids and gradually work up to solid foods.  You may eat anything you prefer, but it is better to start with liquids, then soup and crackers, and gradually work up to solid foods.   3) Please notify your doctor immediately if you have any unusual bleeding, trouble breathing, redness and pain at the surgery site, drainage, fever, or pain not relieved by medication.    4) Additional Instructions:        Please contact your physician with any problems or Same Day Surgery at [phone], Monday through Friday 6 am to 4 pm, or [place] [facility] Main number at [phone].  

## 2019-02-06 NOTE — Transfer of Care (Signed)
Immediate Anesthesia Transfer of Care Note  Patient: Courtney Berger  Procedure(s) Performed: XI ROBOT ASSISTED UMBILICAL HERNIA REPAIR (N/A )  Patient Location: PACU  Anesthesia Type:General  Level of Consciousness: sedated  Airway & Oxygen Therapy: Patient Spontanous Breathing and Patient connected to face mask oxygen  Post-op Assessment: Report given to RN and Post -op Vital signs reviewed and stable  Post vital signs: Reviewed and stable  Last Vitals:  Vitals Value Taken Time  BP    Temp    Pulse 59 02/06/19 1535  Resp 16 02/06/19 1535  SpO2 94 % 02/06/19 1535  Vitals shown include unvalidated device data.  Last Pain:  Vitals:   02/06/19 1135  TempSrc: Oral  PainSc: 0-No pain         Complications: No apparent anesthesia complications

## 2019-02-06 NOTE — Anesthesia Procedure Notes (Signed)
Procedure Name: Awake intubation Performed by: Leander Rams, CRNA Pre-anesthesia Checklist: Patient identified, Emergency Drugs available, Suction available and Patient being monitored Patient Re-evaluated:Patient Re-evaluated prior to induction Oxygen Delivery Method: Circle system utilized Preoxygenation: Pre-oxygenation with 100% oxygen Induction Type: IV induction Ventilation: Mask ventilation without difficulty Laryngoscope Size: McGraph and 3 Grade View: Grade I Tube type: Oral Tube size: 7.0 mm Number of attempts: 1 Airway Equipment and Method: Stylet Placement Confirmation: ETT inserted through vocal cords under direct vision,  positive ETCO2,  CO2 detector and breath sounds checked- equal and bilateral Secured at: 21 cm Tube secured with: Tape Dental Injury: Teeth and Oropharynx as per pre-operative assessment

## 2019-02-09 NOTE — Anesthesia Postprocedure Evaluation (Signed)
Anesthesia Post Note  Patient: Courtney Berger  Procedure(s) Performed: XI ROBOT ASSISTED UMBILICAL HERNIA REPAIR (N/A )  Patient location during evaluation: PACU Anesthesia Type: General Level of consciousness: awake and alert Pain management: pain level controlled Vital Signs Assessment: post-procedure vital signs reviewed and stable Respiratory status: spontaneous breathing, nonlabored ventilation and respiratory function stable Cardiovascular status: blood pressure returned to baseline and stable Postop Assessment: no apparent nausea or vomiting Anesthetic complications: no     Last Vitals:  Vitals:   02/06/19 1650 02/06/19 1725  BP: 133/69 (!) 150/74  Pulse: 62 66  Resp: 14 14  Temp: (!) 36.2 C   SpO2: 98% 100%    Last Pain:  Vitals:   02/06/19 1725  TempSrc:   PainSc: 0-No pain                 Durenda Hurt

## 2019-10-22 ENCOUNTER — Encounter: Payer: Self-pay | Admitting: Emergency Medicine

## 2019-10-22 ENCOUNTER — Emergency Department
Admission: EM | Admit: 2019-10-22 | Discharge: 2019-10-22 | Disposition: A | Discharge status: Home / Self Care | Payer: Medicaid Other | Attending: Emergency Medicine | Admitting: Emergency Medicine

## 2019-10-22 ENCOUNTER — Emergency Department: Payer: Medicaid Other

## 2019-10-22 ENCOUNTER — Other Ambulatory Visit: Payer: Self-pay

## 2019-10-22 DIAGNOSIS — J45909 Unspecified asthma, uncomplicated: Secondary | ICD-10-CM | POA: Diagnosis not present

## 2019-10-22 DIAGNOSIS — F17200 Nicotine dependence, unspecified, uncomplicated: Secondary | ICD-10-CM | POA: Insufficient documentation

## 2019-10-22 DIAGNOSIS — M4316 Spondylolisthesis, lumbar region: Secondary | ICD-10-CM | POA: Diagnosis not present

## 2019-10-22 DIAGNOSIS — Z7982 Long term (current) use of aspirin: Secondary | ICD-10-CM | POA: Diagnosis not present

## 2019-10-22 DIAGNOSIS — I10 Essential (primary) hypertension: Secondary | ICD-10-CM | POA: Diagnosis not present

## 2019-10-22 DIAGNOSIS — M5441 Lumbago with sciatica, right side: Secondary | ICD-10-CM | POA: Diagnosis present

## 2019-10-22 DIAGNOSIS — Z79899 Other long term (current) drug therapy: Secondary | ICD-10-CM | POA: Insufficient documentation

## 2019-10-22 DIAGNOSIS — M4310 Spondylolisthesis, site unspecified: Secondary | ICD-10-CM

## 2019-10-22 DIAGNOSIS — M5431 Sciatica, right side: Secondary | ICD-10-CM

## 2019-10-22 MED ORDER — KETOROLAC TROMETHAMINE 10 MG PO TABS: 10.0000 mg | ORAL_TABLET | Freq: Three times a day (TID) | ORAL | 0 refills | Status: DC

## 2019-10-22 MED ORDER — CYCLOBENZAPRINE HCL 5 MG PO TABS: 5.0000 mg | ORAL_TABLET | Freq: Three times a day (TID) | ORAL | 0 refills | Status: DC | PRN

## 2019-10-22 MED ORDER — KETOROLAC TROMETHAMINE 30 MG/ML IJ SOLN
30.0000 mg | Freq: Once | INTRAMUSCULAR | Status: AC
  Administered 2019-10-22: 30 mg via INTRAVENOUS
  Filled 2019-10-22: qty 1

## 2019-10-22 NOTE — ED Provider Notes (Signed)
[facility] Emergency Department Provider Note ____________________________________________  Time seen: 1507  I have reviewed the triage vital signs and the nursing notes.  HISTORY  Chief Complaint  Hip Pain and Leg Pain  HPI Courtney Berger is a 54 y.o. female presents to the ED for evaluation of intermittent low back pain with some referred pain down the right lower extremity not beyond the knee generally.  She denies any recent trauma, fall, or history of chronic ongoing back pain.  She also denies any bladder or bowel incontinence, foot drop, or saddle anesthesias.  Patient has had episodic pain on the right side but is never been evaluated for her symptoms.  She presents today after noting pain significant enough to impair her walking.  She does note that she received  Pfizer vaccine 1 of 2 on 10/20/19.  Otherwise the patient is without fever, chills, chest pain, dysuria, abdominal pain, or abnormal lower extremity swelling.  Past Medical History:  Diagnosis Date  . Asthma   . Heart murmur   . Hypertension   . Umbilical hernia     Patient Active Problem List   Diagnosis Date Noted  . Obesity with serious comorbidity 05/09/2017  . Tobacco abuse 05/09/2017  . Systolic murmur 05/09/2017  . Hypertension 05/07/2017    Past Surgical History:  Procedure Laterality Date  . DILATION AND CURETTAGE OF UTERUS     multiple  . TONSILLECTOMY    . WRIST SURGERY Right     Prior to Admission medications   Medication Sig Start Date End Date Taking? Authorizing Provider  albuterol (PROVENTIL HFA) 108 (90 Base) MCG/ACT inhaler Inhale 2 puffs into the lungs every 4 (four) hours as needed for wheezing or shortness of breath. 01/31/19   Enid Derry, PA-C  amLODipine (NORVASC) 5 MG tablet Take 5 mg by mouth daily.    [provider]  aspirin EC 81 MG tablet Take 81 mg by mouth daily.    [provider]  CALCIUM-VITAMIN D PO Take 1 tablet by mouth  daily.    [provider]  cyclobenzaprine (FLEXERIL) 5 MG tablet Take 1 tablet (5 mg total) by mouth 3 (three) times daily as needed. 10/22/19   Taggart Prasad, Charlesetta Ivory, PA-C  ELDERBERRY PO Take 2 capsules by mouth 4 (four) times a week.    [provider]  GLUCOSAMINE-CHONDROITIN PO Take 1 tablet by mouth daily.    [provider]  hydrochlorothiazide (HYDRODIURIL) 25 MG tablet Take 1 tablet (25 mg total) by mouth daily. Patient not taking: Reported on 05/22/2017 12/27/16   Sharman Cheek, MD  HYDROcodone-acetaminophen [facility]) 5-325 MG tablet Take 1 tablet by mouth every 6 (six) hours as needed for up to 6 doses for moderate pain. 02/06/19   Tonna Boehringer, Isami, DO  ibuprofen (ADVIL) 800 MG tablet Take 1 tablet (800 mg total) by mouth every 8 (eight) hours as needed for mild pain or moderate pain. 02/06/19   Tonna Boehringer, Isami, DO  ketorolac (TORADOL) 10 MG tablet Take 1 tablet (10 mg total) by mouth every 8 (eight) hours. 10/22/19   Kniyah Khun, Charlesetta Ivory, PA-C  metoprolol tartrate (LOPRESSOR) 50 MG tablet Take 50 mg by mouth 2 (two) times daily. 11/20/18   [provider]  Multiple Vitamin (MULTIVITAMIN WITH MINERALS) TABS tablet Take 1 tablet by mouth daily.    [provider]  vitamin B-12 (CYANOCOBALAMIN) 50 MCG tablet Take 50 mcg by mouth daily.    [provider]  vitamin C (ASCORBIC  ACID) 500 MG tablet Take 500 mg by mouth daily.    [provider]    Allergies Zyrtec [cetirizine]  Family History  Problem Relation Age of Onset  . Diabetes Mother   . Congestive Heart Failure Mother   . Hypertension Mother   . Hyperlipidemia Mother   . Parkinson's disease Father   . Hypertension Father   . Diabetes Father   . Dementia Father   . Diabetes Maternal Grandmother   . Colon cancer Maternal Grandfather   . Alzheimer's disease Paternal Grandmother   . Diabetes Paternal Grandmother   . Angina Paternal Grandfather   . Lung cancer Paternal  Grandfather     Social History Social History   Tobacco Use  . Smoking status: Current Every Day Smoker    Packs/day: 0.50  . Smokeless tobacco: Never Used  Vaping Use  . Vaping Use: Some days  Substance Use Topics  . Alcohol use: No    Comment: socially   . Drug use: No    Review of Systems  Constitutional: Negative for fever. Eyes: Negative for visual changes. ENT: Negative for sore throat. Cardiovascular: Negative for chest pain. Respiratory: Negative for shortness of breath. Gastrointestinal: Negative for abdominal pain, vomiting and diarrhea. Genitourinary: Negative for dysuria. Musculoskeletal: Positive for back pain with right lower extremity referral. Skin: Negative for rash. Neurological: Negative for headaches, focal weakness or numbness. ____________________________________________  PHYSICAL EXAM:  VITAL SIGNS: ED Triage Vitals  Enc Vitals Group     BP 10/22/19 1339 (!) 141/81     Pulse Rate 10/22/19 1339 71     Resp 10/22/19 1339 20     Temp 10/22/19 1339 98.4 F (36.9 C)     Temp Source 10/22/19 1339 Oral     SpO2 10/22/19 1339 98 %     Weight 10/22/19 1340 250 lb (113.4 kg)     Height 10/22/19 1340 5\' 5"  (1.651 m)     Head Circumference --      Peak Flow --      Pain Score 10/22/19 1340 5     Pain Loc --      Pain Edu? --      Excl. in GC? --     Constitutional: Alert and oriented. Well appearing and in no distress. Head: Normocephalic and atraumatic. Eyes: Conjunctivae are normal. Normal extraocular movements Cardiovascular: Normal rate, regular rhythm. Normal distal pulses. Respiratory: Normal respiratory effort. No wheezes/rales/rhonchi. Gastrointestinal: Soft and nontender. No distention. Musculoskeletal: Normal spinal alignment without midline tenderness, spasm, deformity, or step-off.  Patient transitions from sit to stand without assistance.  He localizes pain to the right gluteal and lateral leg pain on the right.  Nontender with  normal range of motion in all extremities.  Neurologic: Cranial nerves II through XII grossly intact.  Normal LE DTRs bilaterally.  Negative seated straight leg raise bilaterally.  Normal gait without ataxia. Normal speech and language. No gross focal neurologic deficits are appreciated. Skin:  Skin is warm, dry and intact. No rash noted. Psychiatric: Mood and affect are normal. Patient exhibits appropriate insight and judgment. ____________________________________________   RADIOLOGY 10/24/19 Spine  IMPRESSION: 1. Extensive multilevel lumbar facet hypertrophy. 2. Grade 1 anterolisthesis of L4 on L5. 3. No acute bony abnormality. ____________________________________________  PROCEDURES  Toradol 30 mg IM  Procedures ____________________________________________  INITIAL IMPRESSION / ASSESSMENT AND PLAN / ED COURSE  Patient with ED evaluation of intermittent right lower extremity pain without preceding trauma or fall.  Patient clinically stable without  any acute neuromuscular deficit.  No red flags on exam.  X-ray does reveal some multilevel facet hypertrophy as well as grade 1 anterior listhesis of L4 on L5.  This may represent patient pain on the right lower extremity.  She is discharged with prescriptions for ketorolac and Flexeril.  She is referred to [facility] for ongoing symptom management.  Return precautions have been reviewed.  Courtney Berger was evaluated in Emergency Department on 10/22/2019 for the symptoms described in the history of present illness. She was evaluated in the context of the global COVID-19 pandemic, which necessitated consideration that the patient might be at risk for infection with the SARS-CoV-2 virus that causes COVID-19. Institutional protocols and algorithms that pertain to the evaluation of patients at risk for COVID-19 are in a state of rapid change based on information released by regulatory bodies including the CDC and federal and state organizations. These  policies and algorithms were followed during the patient's care in the ED. ____________________________________________  FINAL CLINICAL IMPRESSION(S) / ED DIAGNOSES  Final diagnoses:  Sciatica of right side  Anterolisthesis      Karmen Stabs, Charlesetta Ivory, PA-C 10/22/19 Thurston Pounds, MD 10/23/19 [phone]

## 2019-10-22 NOTE — Discharge Instructions (Signed)
Your exam and XR are consistent with some mild lumbar facet hypertrophy. Take the prescription meds as directed. Follow-up with your provider for ongoing symptoms.

## 2019-10-22 NOTE — ED Triage Notes (Signed)
Pt reports for the last few days has had pain that starts in her right hip and radiates all the way down her leg. Denies injuries

## 2019-10-22 NOTE — ED Notes (Signed)
See triage note  Presents with right hip and leg pain  States pain started couple of days ago  Unknown injury

## 2020-07-07 ENCOUNTER — Other Ambulatory Visit: Payer: Self-pay

## 2020-07-07 ENCOUNTER — Encounter: Payer: Self-pay | Admitting: Emergency Medicine

## 2020-07-07 ENCOUNTER — Emergency Department
Admission: EM | Admit: 2020-07-07 | Discharge: 2020-07-07 | Disposition: A | Discharge status: Home / Self Care | Payer: Medicaid Other | Attending: Emergency Medicine | Admitting: Emergency Medicine

## 2020-07-07 ENCOUNTER — Emergency Department: Payer: Medicaid Other

## 2020-07-07 DIAGNOSIS — J45909 Unspecified asthma, uncomplicated: Secondary | ICD-10-CM | POA: Insufficient documentation

## 2020-07-07 DIAGNOSIS — R072 Precordial pain: Secondary | ICD-10-CM | POA: Diagnosis present

## 2020-07-07 DIAGNOSIS — Z79899 Other long term (current) drug therapy: Secondary | ICD-10-CM | POA: Insufficient documentation

## 2020-07-07 DIAGNOSIS — F17200 Nicotine dependence, unspecified, uncomplicated: Secondary | ICD-10-CM | POA: Insufficient documentation

## 2020-07-07 DIAGNOSIS — I10 Essential (primary) hypertension: Secondary | ICD-10-CM | POA: Insufficient documentation

## 2020-07-07 DIAGNOSIS — Z7982 Long term (current) use of aspirin: Secondary | ICD-10-CM | POA: Diagnosis not present

## 2020-07-07 DIAGNOSIS — K2100 Gastro-esophageal reflux disease with esophagitis, without bleeding: Secondary | ICD-10-CM | POA: Insufficient documentation

## 2020-07-07 DIAGNOSIS — R0789 Other chest pain: Secondary | ICD-10-CM

## 2020-07-07 LAB — CBC
HCT: 39.9 % (ref 36.0–46.0)
Hemoglobin: 13.7 g/dL (ref 12.0–15.0)
MCH: 31.7 pg (ref 26.0–34.0)
MCHC: 34.3 g/dL (ref 30.0–36.0)
MCV: 92.4 fL (ref 80.0–100.0)
Platelets: 192 10*3/uL (ref 150–400)
RBC: 4.32 MIL/uL (ref 3.87–5.11)
RDW: 13.4 % (ref 11.5–15.5)
WBC: 9 10*3/uL (ref 4.0–10.5)
nRBC: 0 % (ref 0.0–0.2)

## 2020-07-07 LAB — BASIC METABOLIC PANEL
Anion gap: 9 (ref 5–15)
BUN: 20 mg/dL (ref 6–20)
CO2: 28 mmol/L (ref 22–32)
Calcium: 9.7 mg/dL (ref 8.9–10.3)
Chloride: 104 mmol/L (ref 98–111)
Creatinine, Ser: 0.71 mg/dL (ref 0.44–1.00)
GFR, Estimated: >60 mL/min (ref >60)
Glucose, Bld: 100 mg/dL — ABNORMAL HIGH (ref 70–99)
Potassium: 3.5 mmol/L (ref 3.5–5.1)
Sodium: 141 mmol/L (ref 135–145)

## 2020-07-07 LAB — TROPONIN I (HIGH SENSITIVITY): Troponin I (High Sensitivity): 8 ng/L (ref <18)

## 2020-07-07 MED ORDER — FAMOTIDINE 20 MG PO TABS: 20.0000 mg | ORAL_TABLET | Freq: Two times a day (BID) | ORAL | 1 refills | Status: DC

## 2020-07-07 NOTE — ED Provider Notes (Signed)
[facility] Emergency Department Provider Note ____________________________________________   Event Date/Time   First MD Initiated Contact with Patient 07/07/20 [phone]     (approximate)  I have reviewed the triage vital signs and the nursing notes.  HISTORY  Chief Complaint Chest Pain   HPI Courtney Berger is a 55 y.o. femalewho presents to the ED for evaluation of chest pain.   Chart review indicates hx obesity, HTN.   Patient self-reports a history of acid reflux for which she takes as needed Tums with improvement of her substernal chest burning sensation.  She reports having the symptoms intermittently for the past 3-4 months.  She reports a more severe episode this evening that awoke her from sleep, with typical symptoms of substernal burning, globus sensation and belching, improved with belching and Tums administration.  She reports feeling fine now, has no complaints and refuses my offer for medications here in the ED.   She denies any abdominal pain, exertional chest pain, and indicates her pain gets better with walking and exertion.  Denies emesis, coffee-ground emesis, melena, stool changes or diarrhea.   Past Medical History:  Diagnosis Date  . Asthma   . Heart murmur   . Hypertension   . Umbilical hernia     Patient Active Problem List   Diagnosis Date Noted  . Obesity with serious comorbidity 05/09/2017  . Tobacco abuse 05/09/2017  . Systolic murmur 05/09/2017  . Hypertension 05/07/2017    Past Surgical History:  Procedure Laterality Date  . DILATION AND CURETTAGE OF UTERUS     multiple  . TONSILLECTOMY    . WRIST SURGERY Right     Prior to Admission medications   Medication Sig Start Date End Date Taking? Authorizing Provider  famotidine (PEPCID) 20 MG tablet Take 1 tablet (20 mg total) by mouth 2 (two) times daily. 07/07/20 09/05/20 Yes Delton Prairie, MD  albuterol (PROVENTIL HFA) 108 (90 Base) MCG/ACT inhaler Inhale 2 puffs into  the lungs every 4 (four) hours as needed for wheezing or shortness of breath. 01/31/19   Enid Derry, PA-C  amLODipine (NORVASC) 5 MG tablet Take 5 mg by mouth daily.    [provider]  aspirin EC 81 MG tablet Take 81 mg by mouth daily.    [provider]  CALCIUM-VITAMIN D PO Take 1 tablet by mouth daily.    [provider]  cyclobenzaprine (FLEXERIL) 5 MG tablet Take 1 tablet (5 mg total) by mouth 3 (three) times daily as needed. 10/22/19   Menshew, Charlesetta Ivory, PA-C  ELDERBERRY PO Take 2 capsules by mouth 4 (four) times a week.    [provider]  GLUCOSAMINE-CHONDROITIN PO Take 1 tablet by mouth daily.    [provider]  hydrochlorothiazide (HYDRODIURIL) 25 MG tablet Take 1 tablet (25 mg total) by mouth daily. Patient not taking: Reported on 05/22/2017 12/27/16   Sharman Cheek, MD  HYDROcodone-acetaminophen [facility]) 5-325 MG tablet Take 1 tablet by mouth every 6 (six) hours as needed for up to 6 doses for moderate pain. 02/06/19   Tonna Boehringer, Isami, DO  ibuprofen (ADVIL) 800 MG tablet Take 1 tablet (800 mg total) by mouth every 8 (eight) hours as needed for mild pain or moderate pain. 02/06/19   Tonna Boehringer, Isami, DO  ketorolac (TORADOL) 10 MG tablet Take 1 tablet (10 mg total) by mouth every 8 (eight) hours. 10/22/19   Menshew, Charlesetta Ivory, PA-C  metoprolol tartrate (LOPRESSOR) 50 MG tablet Take 50 mg by mouth  2 (two) times daily. 11/20/18   [provider]  Multiple Vitamin (MULTIVITAMIN WITH MINERALS) TABS tablet Take 1 tablet by mouth daily.    [provider]  vitamin B-12 (CYANOCOBALAMIN) 50 MCG tablet Take 50 mcg by mouth daily.    [provider]  vitamin C (ASCORBIC ACID) 500 MG tablet Take 500 mg by mouth daily.    [provider]    Allergies Zyrtec [cetirizine]  Family History  Problem Relation Age of Onset  . Diabetes Mother   . Congestive Heart Failure Mother   . Hypertension Mother   .  Hyperlipidemia Mother   . Parkinson's disease Father   . Hypertension Father   . Diabetes Father   . Dementia Father   . Diabetes Maternal Grandmother   . Colon cancer Maternal Grandfather   . Alzheimer's disease Paternal Grandmother   . Diabetes Paternal Grandmother   . Angina Paternal Grandfather   . Lung cancer Paternal Grandfather     Social History Social History   Tobacco Use  . Smoking status: Current Every Day Smoker    Packs/day: 0.50  . Smokeless tobacco: Never Used  Vaping Use  . Vaping Use: Some days  Substance Use Topics  . Alcohol use: No    Comment: socially   . Drug use: No    Review of Systems  Constitutional: No fever/chills Eyes: No visual changes. ENT: No sore throat. Cardiovascular: Positive for chest pain. Respiratory: Denies shortness of breath. Gastrointestinal: No abdominal pain.  No nausea, no vomiting.  No diarrhea.  No constipation. Genitourinary: Negative for dysuria. Musculoskeletal: Negative for back pain. Skin: Negative for rash. Neurological: Negative for headaches, focal weakness or numbness.  ____________________________________________   PHYSICAL EXAM:  VITAL SIGNS: Vitals:   07/07/20 0227 07/07/20 0537  BP: (!) 172/100 (!) 157/84  Pulse: 85   Resp: 16 15  Temp: 98.2 F (36.8 C)   SpO2: 96%      Constitutional: Alert and oriented. Well appearing and in no acute distress.  Obese.  Pleasant and conversational in full sentences. Eyes: Conjunctivae are normal. PERRL. EOMI. Head: Atraumatic. Nose: No congestion/rhinnorhea. Mouth/Throat: Mucous membranes are moist.  Oropharynx non-erythematous. Neck: No stridor. No cervical spine tenderness to palpation. Cardiovascular: Normal rate, regular rhythm. Grossly normal heart sounds.  Good peripheral circulation. Respiratory: Normal respiratory effort.  No retractions. Lungs CTAB. Gastrointestinal: Soft , nondistended, nontender to palpation. No CVA tenderness. Musculoskeletal:  No lower extremity tenderness nor edema.  No joint effusions. No signs of acute trauma. Neurologic:  Normal speech and language. No gross focal neurologic deficits are appreciated. No gait instability noted. Skin:  Skin is warm, dry and intact. No rash noted. Psychiatric: Mood and affect are normal. Speech and behavior are normal.  ____________________________________________   LABS (all labs ordered are listed, but only abnormal results are displayed)  Labs Reviewed  BASIC METABOLIC PANEL - Abnormal; Notable for the following components:      Result Value   Glucose, Bld 100 (*)    All other components within normal limits  CBC  POC URINE PREG, ED  TROPONIN I (HIGH SENSITIVITY)  TROPONIN I (HIGH SENSITIVITY)   ____________________________________________  12 Lead EKG  Sinus rhythm, rate 83 bpm.  Normal axis.  First-degree AV block with PR interval 250 ms, incomplete right bundle.  Otherwise normal intervals.  No evidence of acute ischemia. ____________________________________________  RADIOLOGY  ED MD interpretation: CXR reviewed by me without evidence of acute cardiopulmonary pathology.  Official radiology report(s): DG  Chest 2 View  Result Date: 07/07/2020 CLINICAL DATA:  Chest pain, burning EXAM: CHEST - 2 VIEW COMPARISON:  Radiograph 12/27/2016 FINDINGS: No consolidation, features of edema, pneumothorax, or effusion. Pulmonary vascularity is normally distributed. The cardiomediastinal contours are unremarkable. No acute osseous or soft tissue abnormality. Degenerative changes in the right shoulder and spine. IMPRESSION: No acute cardiopulmonary abnormality. Electronically Signed   By: Kreg Shropshire M.D.   On: 07/07/2020 03:25    ____________________________________________   PROCEDURES and INTERVENTIONS  Procedure(s) performed (including Critical Care):  .1-3 Lead EKG Interpretation Performed by: Delton Prairie, MD Authorized by: Delton Prairie, MD     Interpretation:  normal     ECG rate:  80   ECG rate assessment: normal     Rhythm: sinus rhythm     Ectopy: none     Conduction: normal      Medications - No data to display  ____________________________________________   MDM / ED COURSE   55 year old woman without history of cardiac disease presents to the ED with substernal chest burning, resolved with Tums, without evidence of cardiac pathology and amenable to outpatient management.  Exam reassuring without evidence of acute derangements.  She remains asymptomatic here in the ED.  Work-up is benign without evidence of ACS, pulmonary infiltrates, PTX.  EKG shows some mild and benign interval derangements without evidence of ACS and her troponin is negative.  Provided patient prescription for H2 blocker, how to establish with PCP and we discussed return precautions for the ED prior to discharge.      ____________________________________________   FINAL CLINICAL IMPRESSION(S) / ED DIAGNOSES  Final diagnoses:  Other chest pain  Gastroesophageal reflux disease with esophagitis without hemorrhage     ED Discharge Orders         Ordered    famotidine (PEPCID) 20 MG tablet  2 times daily        07/07/20 0545           Abrial Arrighi Katrinka Blazing   Note:  This document was prepared using Dragon voice recognition software and may include unintentional dictation errors.   Delton Prairie, MD 07/07/20 [phone]

## 2020-07-07 NOTE — Discharge Instructions (Signed)
As we discussed, no signs of heart attack.  Continue all of your other medications at home.  Start taking famotidine/Pepcid twice daily every day to help reduce acid production in your stomach.  It is safe to use Tums as well as needed for any further burning.  If you develop worsening symptoms despite these medications, fevers or passing out or symptoms, please return to the ED.

## 2020-07-07 NOTE — ED Triage Notes (Signed)
Pt to ED from home c/o mid chest pain that is burning, normally takes tums which helps but did not take any tonight.  Denies SOB or n/v/d.  Pt A&Ox4, chest rise even and unlabored, skin WNL, in NAD at this time.

## 2020-08-07 ENCOUNTER — Emergency Department: Payer: Medicaid Other

## 2020-08-07 ENCOUNTER — Other Ambulatory Visit: Payer: Self-pay

## 2020-08-07 ENCOUNTER — Emergency Department
Admission: EM | Admit: 2020-08-07 | Discharge: 2020-08-07 | Disposition: A | Discharge status: Home / Self Care | Payer: Medicaid Other | Attending: Emergency Medicine | Admitting: Emergency Medicine

## 2020-08-07 DIAGNOSIS — W010XXA Fall on same level from slipping, tripping and stumbling without subsequent striking against object, initial encounter: Secondary | ICD-10-CM | POA: Insufficient documentation

## 2020-08-07 DIAGNOSIS — S92515A Nondisplaced fracture of proximal phalanx of left lesser toe(s), initial encounter for closed fracture: Secondary | ICD-10-CM | POA: Diagnosis not present

## 2020-08-07 DIAGNOSIS — J45909 Unspecified asthma, uncomplicated: Secondary | ICD-10-CM | POA: Diagnosis not present

## 2020-08-07 DIAGNOSIS — I10 Essential (primary) hypertension: Secondary | ICD-10-CM | POA: Diagnosis not present

## 2020-08-07 DIAGNOSIS — F17200 Nicotine dependence, unspecified, uncomplicated: Secondary | ICD-10-CM | POA: Diagnosis not present

## 2020-08-07 DIAGNOSIS — B351 Tinea unguium: Secondary | ICD-10-CM | POA: Insufficient documentation

## 2020-08-07 DIAGNOSIS — Z7952 Long term (current) use of systemic steroids: Secondary | ICD-10-CM | POA: Insufficient documentation

## 2020-08-07 DIAGNOSIS — Z7982 Long term (current) use of aspirin: Secondary | ICD-10-CM | POA: Diagnosis not present

## 2020-08-07 DIAGNOSIS — Z79899 Other long term (current) drug therapy: Secondary | ICD-10-CM | POA: Insufficient documentation

## 2020-08-07 DIAGNOSIS — S99922A Unspecified injury of left foot, initial encounter: Secondary | ICD-10-CM | POA: Diagnosis present

## 2020-08-07 MED ORDER — IBUPROFEN 600 MG PO TABS: 600.0000 mg | ORAL_TABLET | Freq: Four times a day (QID) | ORAL | 0 refills | Status: DC | PRN

## 2020-08-07 NOTE — Discharge Instructions (Signed)
Follow-up with your primary care provider or Dr. Logan Bores who is on-call for podiatry for follow-up of your fractured toe and also your fungal infection of your nail.  Wear the postop shoe to prevent your foot from bending to protect and give extra support to your fracture.  This should heal within 4 weeks.  Ice and elevation for today.

## 2020-08-07 NOTE — ED Triage Notes (Signed)
Pt was walking dogs last night, had a trip and fall and hurt her left foot. Pt is ambulatory to room with no swelling to left foot.

## 2020-08-07 NOTE — ED Provider Notes (Signed)
[facility] Emergency Department Provider Note  ____________________________________________   Event Date/Time   First MD Initiated Contact with Patient 08/07/20 1130     (approximate)  I have reviewed the triage vital signs and the nursing notes.   HISTORY  Chief Complaint Toe Pain   HPI Courtney Berger is a 55 y.o. female presents to the ED with complaint of left foot pain.  Patient states that she was walking her dogs last night and tripped and fell.  This morning she states that her foot was swollen and she was unable to get her shoe on.  She also reports that many months ago she dropped a 2 L drink on her left great toe with nail damage but never saw anyone.  She states that the nail has grown back "funny" and occasionally hurts.  Rates her pain as 7 out of 10.       Past Medical History:  Diagnosis Date  . Asthma   . Heart murmur   . Hypertension   . Umbilical hernia     Patient Active Problem List   Diagnosis Date Noted  . Obesity with serious comorbidity 05/09/2017  . Tobacco abuse 05/09/2017  . Systolic murmur 05/09/2017  . Hypertension 05/07/2017    Past Surgical History:  Procedure Laterality Date  . DILATION AND CURETTAGE OF UTERUS     multiple  . TONSILLECTOMY    . WRIST SURGERY Right     Prior to Admission medications   Medication Sig Start Date End Date Taking? Authorizing Provider  amLODipine (NORVASC) 5 MG tablet Take 5 mg by mouth daily.    [provider]  aspirin EC 81 MG tablet Take 81 mg by mouth daily.    [provider]  CALCIUM-VITAMIN D PO Take 1 tablet by mouth daily.    [provider]  ELDERBERRY PO Take 2 capsules by mouth 4 (four) times a week.    [provider]  famotidine (PEPCID) 20 MG tablet Take 1 tablet (20 mg total) by mouth 2 (two) times daily. 07/07/20 09/05/20  Delton Prairie, MD  GLUCOSAMINE-CHONDROITIN PO Take 1 tablet by mouth daily.    [provider]   ibuprofen (ADVIL) 600 MG tablet Take 1 tablet (600 mg total) by mouth every 6 (six) hours as needed. 08/07/20   Tommi Rumps, PA-C  metoprolol tartrate (LOPRESSOR) 50 MG tablet Take 50 mg by mouth 2 (two) times daily. 11/20/18   [provider]  Multiple Vitamin (MULTIVITAMIN WITH MINERALS) TABS tablet Take 1 tablet by mouth daily.    [provider]  vitamin B-12 (CYANOCOBALAMIN) 50 MCG tablet Take 50 mcg by mouth daily.    [provider]  vitamin C (ASCORBIC ACID) 500 MG tablet Take 500 mg by mouth daily.    [provider]  albuterol (PROVENTIL HFA) 108 (90 Base) MCG/ACT inhaler Inhale 2 puffs into the lungs every 4 (four) hours as needed for wheezing or shortness of breath. 01/31/19 08/07/20  Enid Derry, PA-C  hydrochlorothiazide (HYDRODIURIL) 25 MG tablet Take 1 tablet (25 mg total) by mouth daily. Patient not taking: Reported on 05/22/2017 12/27/16 08/07/20  Sharman Cheek, MD    Allergies Zyrtec [cetirizine]  Family History  Problem Relation Age of Onset  . Diabetes Mother   . Congestive Heart Failure Mother   . Hypertension Mother   . Hyperlipidemia Mother   . Parkinson's disease Father   . Hypertension Father   . Diabetes Father   .  Dementia Father   . Diabetes Maternal Grandmother   . Colon cancer Maternal Grandfather   . Alzheimer's disease Paternal Grandmother   . Diabetes Paternal Grandmother   . Angina Paternal Grandfather   . Lung cancer Paternal Grandfather     Social History Social History   Tobacco Use  . Smoking status: Current Every Day Smoker    Packs/day: 0.50  . Smokeless tobacco: Never Used  Vaping Use  . Vaping Use: Some days  Substance Use Topics  . Alcohol use: No    Comment: socially   . Drug use: No    Review of Systems Constitutional: No fever/chills Eyes: No visual changes. ENT: No trauma. Cardiovascular: Denies chest pain. Respiratory: Denies shortness of breath. Gastrointestinal: No  abdominal pain.  No nausea, no vomiting.  Musculoskeletal: Positive left foot pain. Skin: Positive for bruising. Neurological: Negative for headaches, focal weakness or numbness. ____________________________________________   PHYSICAL EXAM:  VITAL SIGNS: ED Triage Vitals  Enc Vitals Group     BP 08/07/20 1138 (!) 150/83     Pulse Rate 08/07/20 1138 64     Resp 08/07/20 1138 16     Temp 08/07/20 1138 98.3 F (36.8 C)     Temp Source 08/07/20 1138 Oral     SpO2 08/07/20 1138 98 %     Weight 08/07/20 1141 260 lb (117.9 kg)     Height 08/07/20 1141 5\' 5"  (1.651 m)     Head Circumference --      Peak Flow --      Pain Score 08/07/20 1141 7     Pain Loc --      Pain Edu? --      Excl. in GC? --     Constitutional: Alert and oriented. Well appearing and in no acute distress. Eyes: Conjunctivae are normal. PERRL. EOMI. Head: Atraumatic. Nose: No trauma. Mouth/Throat: No trauma. Neck: No stridor.  No tenderness on palpation cervical spine posteriorly. Cardiovascular: Normal rate, regular rhythm. Grossly normal heart sounds.  Good peripheral circulation. Respiratory: Normal respiratory effort.  No retractions. Lungs CTAB. Gastrointestinal: Soft and nontender. No distention. Musculoskeletal: No tenderness on palpation of the thoracic or lumbar spine.  On examination of the left foot there is ecchymosis and soft tissue edema noted on the dorsal aspect.  There is increased pain with palpation of the second and third metatarsal area.  No gross deformity is noted.  Skin is intact.  On examination of the left great toe there is a fungal nail present.  Nail is intact.  There is a ridge on the medial aspect of the nail. Neurologic:  Normal speech and language. No gross focal neurologic deficits are appreciated.  Skin:  Skin is warm, dry and intact.  Psychiatric: Mood and affect are normal. Speech and behavior are normal.  ____________________________________________   LABS (all labs  ordered are listed, but only abnormal results are displayed)  Labs Reviewed - No data to display ____________________________________________  RADIOLOGY I, 10/07/20, personally viewed and evaluated these images (plain radiographs) as part of my medical decision making, as well as reviewing the written report by the radiologist.   Official radiology report(s): DG Foot Complete Left  Result Date: 08/07/2020 CLINICAL DATA:  Pain following fall EXAM: LEFT FOOT - COMPLETE 3+ VIEW COMPARISON:  None. FINDINGS: Frontal, oblique, and lateral views were obtained. There is a fracture of the distal aspect of the second proximal phalanx with subtle impaction in this area. No other fracture. No dislocation. Joint  spaces appear normal. No erosive change. Small posterior calcaneal spur noted. IMPRESSION: Rather subtle fracture distal aspect of second proximal phalanx with impaction at the fracture site. No other appreciable fracture. No dislocation. No appreciable joint space narrowing or erosion. Electronically Signed   By: Bretta Bang III M.D.   On: 08/07/2020 12:15    ____________________________________________   PROCEDURES  Procedure(s) performed (including Critical Care):  Procedures   ____________________________________________   INITIAL IMPRESSION / ASSESSMENT AND PLAN / ED COURSE  As part of my medical decision making, I reviewed the following data within the electronic MEDICAL RECORD NUMBER Notes from prior ED visits and [place] Controlled Substance Database  55 year old female presents to the ED with complaint of left foot pain since falling while walking her dogs last night.  She denies any head injury or loss of consciousness.  Patient has some tenderness on palpation of the distal second and third metatarsal.  No gross deformity was noted but soft tissue edema is present.  Patient also has fungal infection of her great toenail which is also deformed secondary to an injury unrelated  to last night.  X-rays show a fracture of the second digit proximal phalanx.  Buddy tape was applied to the first and second digit and patient was placed in a postop shoe.  She is encouraged to follow-up with a podiatrist and the person who is on-call this weekend is listed on her discharge papers.  She also is aware that her nail fungus can be treated.  Note for work was given.  ____________________________________________   FINAL CLINICAL IMPRESSION(S) / ED DIAGNOSES  Final diagnoses:  Closed nondisplaced fracture of proximal phalanx of lesser toe of left foot, initial encounter  Fungal infection of nail     ED Discharge Orders         Ordered    ibuprofen (ADVIL) 600 MG tablet  Every 6 hours PRN,   Status:  Discontinued        08/07/20 1238    ibuprofen (ADVIL) 600 MG tablet  Every 6 hours PRN        08/07/20 1258           Note:  This document was prepared using Dragon voice recognition software and may include unintentional dictation errors.    Tommi Rumps, PA-C 08/07/20 1322    Gilles Chiquito, MD 08/07/20 [phone]

## 2020-08-09 ENCOUNTER — Encounter: Payer: Self-pay | Admitting: *Deleted

## 2020-08-09 ENCOUNTER — Ambulatory Visit (INDEPENDENT_AMBULATORY_CARE_PROVIDER_SITE_OTHER): Payer: Medicaid Other | Admitting: Podiatry

## 2020-08-09 ENCOUNTER — Ambulatory Visit (INDEPENDENT_AMBULATORY_CARE_PROVIDER_SITE_OTHER): Payer: Medicaid Other

## 2020-08-09 ENCOUNTER — Other Ambulatory Visit: Payer: Self-pay

## 2020-08-09 DIAGNOSIS — S92511A Displaced fracture of proximal phalanx of right lesser toe(s), initial encounter for closed fracture: Secondary | ICD-10-CM

## 2020-08-09 DIAGNOSIS — S92505A Nondisplaced unspecified fracture of left lesser toe(s), initial encounter for closed fracture: Secondary | ICD-10-CM | POA: Diagnosis not present

## 2020-08-09 DIAGNOSIS — L600 Ingrowing nail: Secondary | ICD-10-CM

## 2020-08-09 MED ORDER — GENTAMICIN SULFATE 0.1 % EX CREA
1.0000 | TOPICAL_CREAM | Freq: Two times a day (BID) | CUTANEOUS | 1 refills | Status: DC
Start: 2020-08-09 — End: 2023-11-08

## 2020-08-09 NOTE — Progress Notes (Signed)
   Subjective: Patient presents today for evaluation of pain to the lateral border left great toe. Patient is concerned for possible ingrown nail.  Patient states that it all began when she dropped a 3 L bottle of soda on her left great toe approximately 1 year ago.    Patient also states that a few days ago she was in the emergency department for an injury when she tripped and fell to her left foot while walking her dog.  She was diagnosed with a fracture of the left second toe and referred here for follow-up.  She presents today wearing a postsurgical shoe.  DOI: 08/07/2020.  Patient presents today for further treatment and evaluation.  Past Medical History:  Diagnosis Date  . Asthma   . Heart murmur   . Hypertension   . Umbilical hernia     Objective:  General: Well developed, nourished, in no acute distress, alert and oriented x3   Dermatology: Skin is warm, dry and supple bilateral.  Lateral border left great toe appears to be erythematous with evidence of an ingrowing nail. Pain on palpation noted to the border of the nail fold. The remaining nails appear unremarkable at this time. There are no open sores, lesions.  Vascular: Dorsalis Pedis artery and Posterior Tibial artery pedal pulses palpable. No lower extremity edema noted.   Neruologic: Grossly intact via light touch bilateral.  Musculoskeletal: Muscular strength within normal limits in all groups bilateral. Normal range of motion noted to all pedal and ankle joints.  There is associated tenderness to palpation to the left second toe  Radiographic exam: Closed nondisplaced fracture of the distal head of the proximal phalanx to the second toe.  Assesement: #1 Paronychia with ingrowing nail lateral border left great toe #2  Fracture proximal phalanx left second toe  Plan of Care:  1. Patient evaluated.  2. Discussed treatment alternatives and plan of care. Explained nail avulsion procedure and post procedure course to  patient. 3. Patient opted for permanent partial nail avulsion of the lateral border left second toe.  4. Prior to procedure, local anesthesia infiltration utilized using 3 ml of a 50:50 mixture of 2% plain lidocaine and 0.5% plain marcaine in a normal hallux block fashion and a betadine prep performed.  5. Partial permanent nail avulsion with chemical matrixectomy performed using 3x30sec applications of phenol followed by alcohol flush.  6. Light dressing applied.  Post care instructions provided 7.  Prescription for gentamicin cream  8.  Continue postsurgical shoe  9.  Return to clinic 6 weeks for follow-up x-ray.  *Works at [employer]   Felecia Shelling, DPM [facility]  Dr. Felecia Shelling, DPM    [address] [place], [place] [address]                Office [phone]  Fax [phone]

## 2020-08-09 NOTE — Patient Instructions (Signed)

## 2020-09-20 ENCOUNTER — Other Ambulatory Visit: Payer: Self-pay

## 2020-09-20 ENCOUNTER — Ambulatory Visit (INDEPENDENT_AMBULATORY_CARE_PROVIDER_SITE_OTHER): Payer: Medicaid Other | Admitting: Podiatry

## 2020-09-20 ENCOUNTER — Ambulatory Visit (INDEPENDENT_AMBULATORY_CARE_PROVIDER_SITE_OTHER): Payer: Medicaid Other

## 2020-09-20 DIAGNOSIS — S92505A Nondisplaced unspecified fracture of left lesser toe(s), initial encounter for closed fracture: Secondary | ICD-10-CM

## 2020-09-20 DIAGNOSIS — S92512A Displaced fracture of proximal phalanx of left lesser toe(s), initial encounter for closed fracture: Secondary | ICD-10-CM | POA: Diagnosis not present

## 2020-09-20 NOTE — Progress Notes (Signed)
   HPI: 55 y.o. female presenting today for follow-up evaluation of a fracture to the left second toe.  Patient states that she has been working 2 jobs while on her broken toe.  She has been wearing hey dude shoes with the most comfort.  She presents for follow-up treatment and evaluation  Past Medical History:  Diagnosis Date   Asthma    Heart murmur    Hypertension    Umbilical hernia      Physical Exam: General: The patient is alert and oriented x3 in no acute distress.  Dermatology: Skin is warm, dry and supple bilateral lower extremities. Negative for open lesions or macerations.  Vascular: Palpable pedal pulses bilaterally. No edema or erythema noted. Capillary refill within normal limits.  Neurological: Epicritic and protective threshold grossly intact bilaterally.   Musculoskeletal Exam: Range of motion within normal limits to all pedal and ankle joints bilateral. Muscle strength 5/5 in all groups bilateral.   Radiographic Exam:  There continues to be fragmented fracture of the distal head of the proximal phalanx of the left second toe.  This should potentially unionized over time.  Decent alignment of the second toe overall.  Assessment: 1.  Fracture distal phalanx left second toe   Plan of Care:  1. Patient evaluated. X-Rays reviewed.  2.  Continue to recommend rest ice compression and elevation.  Reduce activity is much as possible. 3.  Return to clinic as needed      Felecia Shelling, DPM [facility]  Dr. Felecia Shelling, DPM    [address] [place], [place] [address]                Office [phone]  Fax [phone]
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
## 2021-01-18 ENCOUNTER — Other Ambulatory Visit: Payer: Self-pay | Admitting: Surgery

## 2021-01-18 DIAGNOSIS — N6311 Unspecified lump in the right breast, upper outer quadrant: Secondary | ICD-10-CM

## 2021-01-18 NOTE — Progress Notes (Signed)
 Subjective:   CC: Mass of upper outer quadrant of right breast [N63.11]  HPI: Courtney Berger is a 55 y.o. female who was referred by Lucita Garnette Hinds, MD for evaluation of above. First noted a few days ago.  Symptoms include: Pain is dull, discomfort.  Exacerbated by nothing.  Alleviated by nothing.  Associated with drainage. Hx of similar episodes in same spot before.  Last mammogram 5 or so yrs ago.     Past Medical History:  has a past medical history of Hypertension (05/07/2017) and Tobacco abuse (05/09/2017).  Past Surgical History:  has a past surgical history that includes Hernia repair (02/06/2019).  Family History: family history is not on file.  Social History:  reports that she has been smoking cigarettes. She has never used smokeless tobacco. No history on file for alcohol use and drug use.  Current Medications: has a current medication list which includes the following prescription(s): albuterol , amlodipine , aspirin , doxycycline , metoprolol  tartrate, ascorbic acid (vitamin c), cyanocobalamin (vitamin b-12), and famotidine .  Allergies:  Allergies  Allergen Reactions  . Cetirizine Itching and Swelling    ROS:  A 15 point review of systems was performed and pertinent positives and negatives noted in HPI   Objective:    BP 138/83   Pulse 68   Ht 165.1 cm (5' 5)   Wt (!) 121.6 kg (268 lb)   BMI 44.60 kg/m   Constitutional :  No distress, cooperative, alert  Lymphatics/Throat:  Supple with no lymphadenopathy  Respiratory:  Clear to auscultation bilaterally  Cardiovascular:  Regular rate and rhythm  Gastrointestinal: Soft, non-tender, non-distended, no organomegaly.  Musculoskeletal: Steady gait and movement  Skin: Cool and moist,   Psychiatric: Normal affect, non-agitated, not confused  Breast: Chaperone present for exam.  Right breast with area of crusting above nipple at 12oclock position, no obvious discharge.  Deep to this area is a firm portion of tissue  but no obvious distinct mass, no other obvious skin changes or adenopathy.  Left side negative for any pathology      LABS:  n/a   RADS: Pending mammog  Assessment:     Mass of upper outer quadrant of right breast [N63.11]  Plan:    1. Mass of upper outer quadrant of right breast [N63.11] recurrent breast abscess?  Will proceed with mammogram initially to further evaluate

## 2021-12-04 IMAGING — DX DG FOOT COMPLETE 3+V*L*
3 series · 3 of 3 positions shown · non-contrast
Comparison: None.

CLINICAL DATA: Pain following fall

EXAM:
LEFT FOOT - COMPLETE 3+ VIEW

[foot ap]
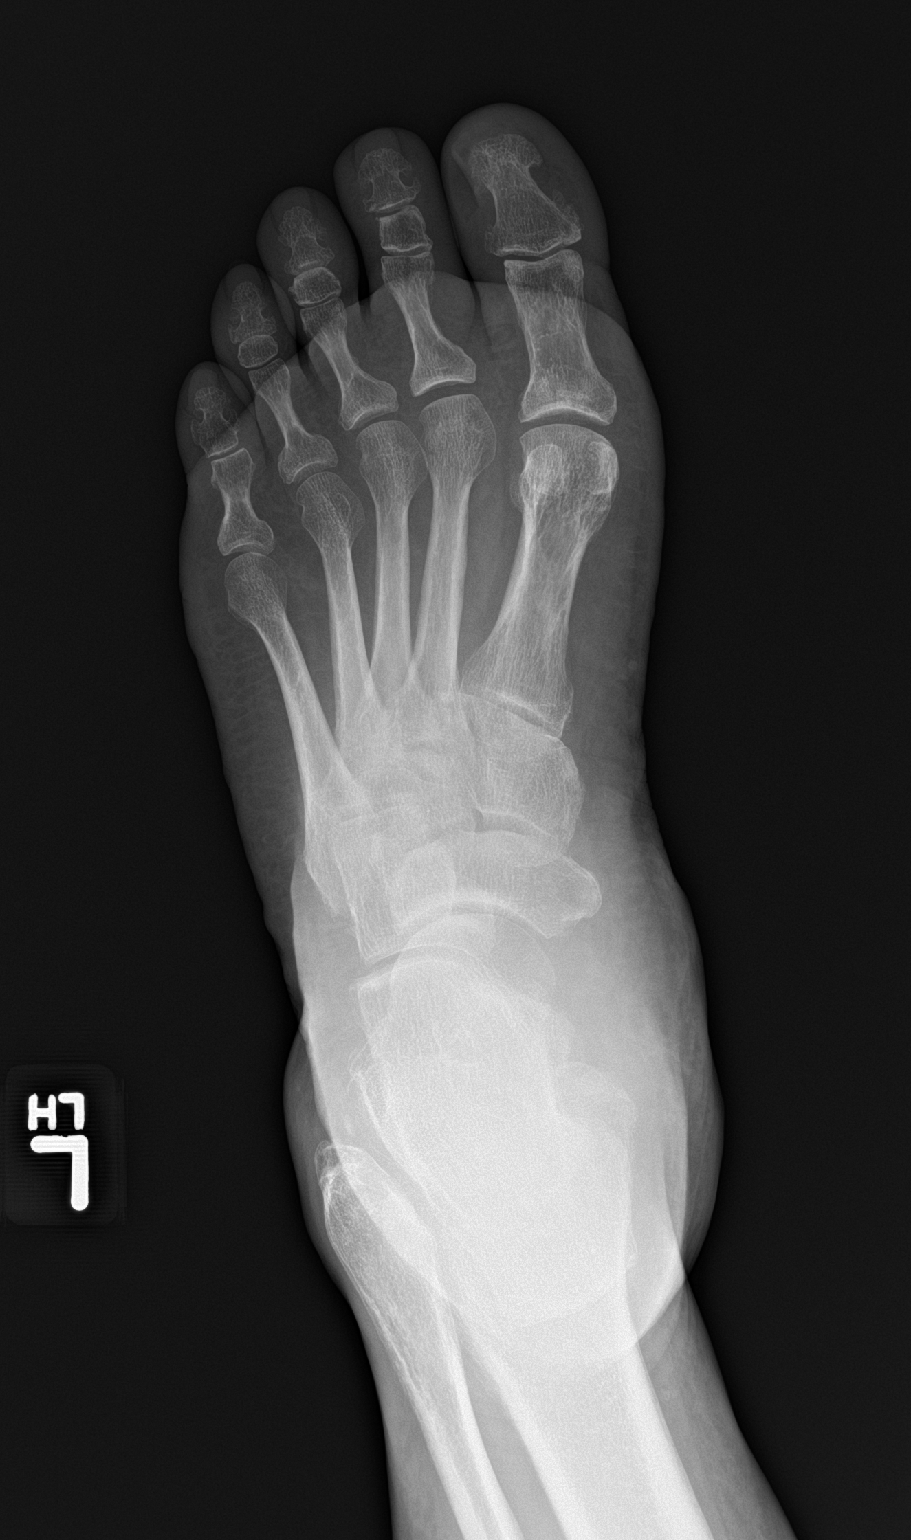

[foot obl]
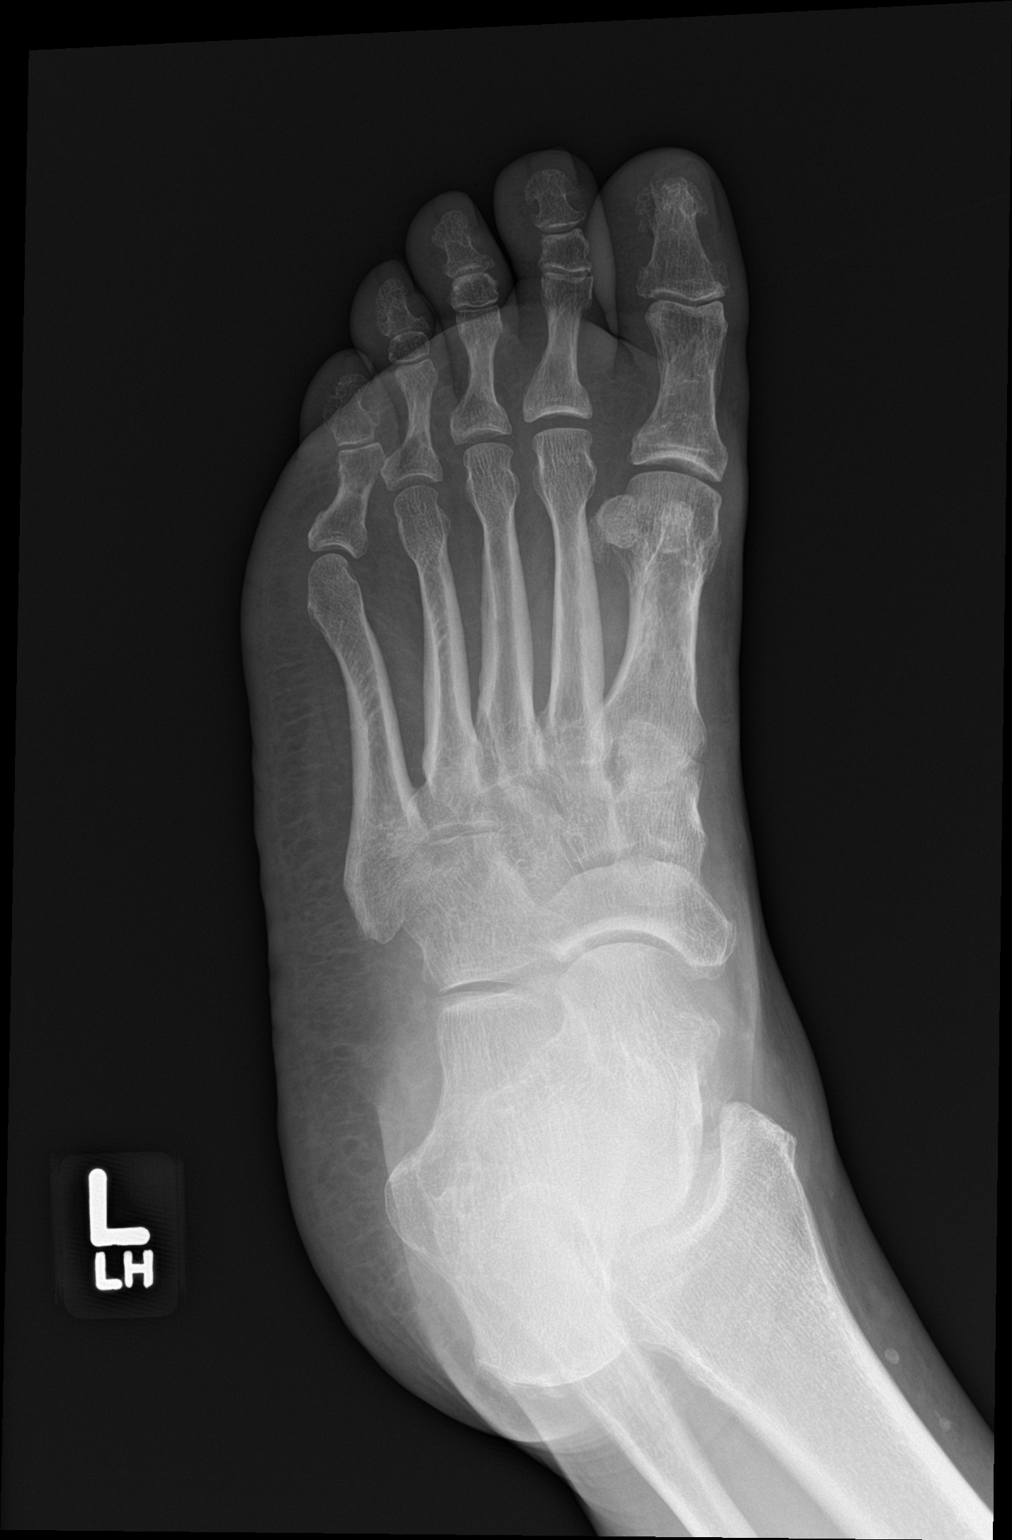

[foot lat]
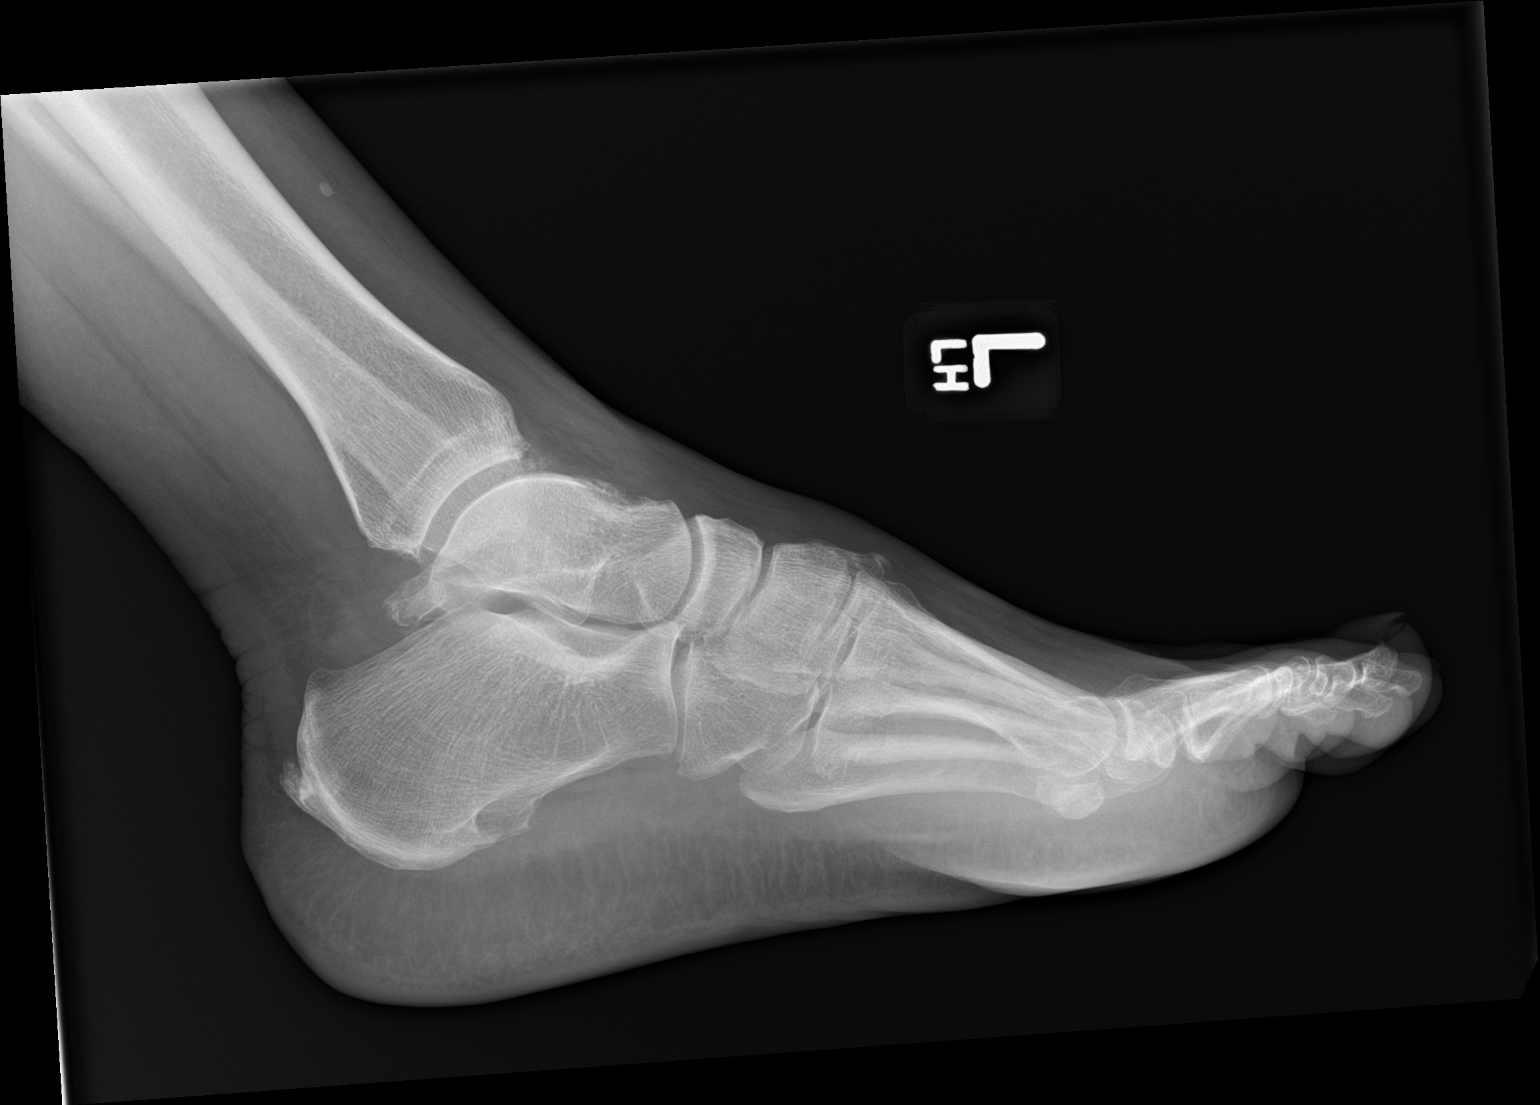

[3 of 3 positions shown; findings below may reference images not displayed]

FINDINGS: Frontal, oblique, and lateral views were obtained. There is a
fracture of the distal aspect of the second proximal phalanx with
subtle impaction in this area. No other fracture. No dislocation.
Joint spaces appear normal. No erosive change. Small posterior
calcaneal spur noted.
IMPRESSION: Rather subtle fracture distal aspect of second proximal phalanx with
impaction at the fracture site. No other appreciable fracture. No
dislocation. No appreciable joint space narrowing or erosion.

## 2023-11-08 ENCOUNTER — Inpatient Hospital Stay
Admission: EM | Admit: 2023-11-08 | Discharge: 2023-11-10 | DRG: 189 | Disposition: A | Discharge status: Home / Self Care | Attending: Internal Medicine | Admitting: Internal Medicine

## 2023-11-08 ENCOUNTER — Other Ambulatory Visit: Payer: Self-pay

## 2023-11-08 ENCOUNTER — Encounter: Payer: Self-pay | Admitting: *Deleted

## 2023-11-08 ENCOUNTER — Emergency Department

## 2023-11-08 DIAGNOSIS — J441 Chronic obstructive pulmonary disease with (acute) exacerbation: Principal | ICD-10-CM | POA: Diagnosis present

## 2023-11-08 DIAGNOSIS — Z720 Tobacco use: Secondary | ICD-10-CM | POA: Diagnosis present

## 2023-11-08 DIAGNOSIS — R0902 Hypoxemia: Secondary | ICD-10-CM | POA: Diagnosis present

## 2023-11-08 DIAGNOSIS — Z800 Family history of malignant neoplasm of digestive organs: Secondary | ICD-10-CM

## 2023-11-08 DIAGNOSIS — I10 Essential (primary) hypertension: Secondary | ICD-10-CM | POA: Diagnosis present

## 2023-11-08 DIAGNOSIS — Z79899 Other long term (current) drug therapy: Secondary | ICD-10-CM

## 2023-11-08 DIAGNOSIS — E66813 Obesity, class 3: Secondary | ICD-10-CM | POA: Diagnosis present

## 2023-11-08 DIAGNOSIS — Z8249 Family history of ischemic heart disease and other diseases of the circulatory system: Secondary | ICD-10-CM | POA: Diagnosis not present

## 2023-11-08 DIAGNOSIS — Z6841 Body Mass Index (BMI) 40.0 and over, adult: Secondary | ICD-10-CM | POA: Diagnosis not present

## 2023-11-08 DIAGNOSIS — Z1152 Encounter for screening for COVID-19: Secondary | ICD-10-CM | POA: Diagnosis not present

## 2023-11-08 DIAGNOSIS — Z820 Family history of epilepsy and other diseases of the nervous system: Secondary | ICD-10-CM

## 2023-11-08 DIAGNOSIS — Z801 Family history of malignant neoplasm of trachea, bronchus and lung: Secondary | ICD-10-CM | POA: Diagnosis not present

## 2023-11-08 DIAGNOSIS — J9621 Acute and chronic respiratory failure with hypoxia: Secondary | ICD-10-CM | POA: Diagnosis present

## 2023-11-08 DIAGNOSIS — Z833 Family history of diabetes mellitus: Secondary | ICD-10-CM

## 2023-11-08 DIAGNOSIS — E669 Obesity, unspecified: Secondary | ICD-10-CM | POA: Diagnosis present

## 2023-11-08 DIAGNOSIS — Z83438 Family history of other disorder of lipoprotein metabolism and other lipidemia: Secondary | ICD-10-CM

## 2023-11-08 DIAGNOSIS — F17200 Nicotine dependence, unspecified, uncomplicated: Secondary | ICD-10-CM | POA: Diagnosis present

## 2023-11-08 DIAGNOSIS — Z7982 Long term (current) use of aspirin: Secondary | ICD-10-CM

## 2023-11-08 LAB — CBC
HCT: 44.2 % (ref 36.0–46.0)
Hemoglobin: 15 g/dL (ref 12.0–15.0)
MCH: 31.1 pg (ref 26.0–34.0)
MCHC: 33.9 g/dL (ref 30.0–36.0)
MCV: 91.7 fL (ref 80.0–100.0)
Platelets: 179 K/uL (ref 150–400)
RBC: 4.82 MIL/uL (ref 3.87–5.11)
RDW: 13.7 % (ref 11.5–15.5)
WBC: 10.8 K/uL — ABNORMAL HIGH (ref 4.0–10.5)
nRBC: 0 % (ref 0.0–0.2)

## 2023-11-08 LAB — RESP PANEL BY RT-PCR (RSV, FLU A&B, COVID)  RVPGX2
Influenza A by PCR: NEGATIVE
Influenza B by PCR: NEGATIVE
Resp Syncytial Virus by PCR: NEGATIVE
SARS Coronavirus 2 by RT PCR: NEGATIVE

## 2023-11-08 LAB — BLOOD GAS, VENOUS
Acid-Base Excess: 4.5 mmol/L — ABNORMAL HIGH (ref 0.0–2.0)
Bicarbonate: 30.4 mmol/L — ABNORMAL HIGH (ref 20.0–28.0)
O2 Saturation: 80.1 %
Patient temperature: 37
pCO2, Ven: 49 mmHg (ref 44–60)
pH, Ven: 7.4 (ref 7.25–7.43)
pO2, Ven: 49 mmHg — ABNORMAL HIGH (ref 32–45)

## 2023-11-08 LAB — BASIC METABOLIC PANEL WITH GFR
Anion gap: 12 (ref 5–15)
BUN: 9 mg/dL (ref 6–20)
CO2: 24 mmol/L (ref 22–32)
Calcium: 9 mg/dL (ref 8.9–10.3)
Chloride: 99 mmol/L (ref 98–111)
Creatinine, Ser: 0.73 mg/dL (ref 0.44–1.00)
GFR, Estimated: >60 mL/min (ref >60)
Glucose, Bld: 99 mg/dL (ref 70–99)
Potassium: 4.2 mmol/L (ref 3.5–5.1)
Sodium: 135 mmol/L (ref 135–145)

## 2023-11-08 LAB — BRAIN NATRIURETIC PEPTIDE: B Natriuretic Peptide: 119.8 pg/mL — ABNORMAL HIGH (ref 0.0–100.0)

## 2023-11-08 MED ORDER — LACTATED RINGERS IV SOLN: INTRAVENOUS | Status: DC

## 2023-11-08 MED ORDER — METOPROLOL TARTRATE 50 MG PO TABS
50.0000 mg | ORAL_TABLET | Freq: Two times a day (BID) | ORAL | Status: DC
  Administered 2023-11-09 – 2023-11-10 (×4): 50 mg via ORAL
  Filled 2023-11-08: qty 2
  Filled 2023-11-08: qty 1
  Filled 2023-11-08: qty 2
  Filled 2023-11-08: qty 1

## 2023-11-08 MED ORDER — ALBUTEROL SULFATE (2.5 MG/3ML) 0.083% IN NEBU
2.5000 mg | INHALATION_SOLUTION | Freq: Once | RESPIRATORY_TRACT | Status: AC
  Administered 2023-11-08: 2.5 mg via RESPIRATORY_TRACT
  Filled 2023-11-08: qty 3

## 2023-11-08 MED ORDER — DOXYCYCLINE HYCLATE 100 MG PO TABS
100.0000 mg | ORAL_TABLET | Freq: Two times a day (BID) | ORAL | Status: DC
  Administered 2023-11-09 – 2023-11-10 (×4): 100 mg via ORAL
  Filled 2023-11-08 (×4): qty 1

## 2023-11-08 MED ORDER — ENOXAPARIN SODIUM 40 MG/0.4ML IJ SOSY: 40.0000 mg | PREFILLED_SYRINGE | INTRAMUSCULAR | Status: DC

## 2023-11-08 MED ORDER — AMLODIPINE BESYLATE 10 MG PO TABS
10.0000 mg | ORAL_TABLET | Freq: Every day | ORAL | Status: DC
  Administered 2023-11-09 – 2023-11-10 (×2): 10 mg via ORAL
  Filled 2023-11-08: qty 1
  Filled 2023-11-08: qty 2

## 2023-11-08 MED ORDER — NICOTINE 21 MG/24HR TD PT24
21.0000 mg | MEDICATED_PATCH | Freq: Every day | TRANSDERMAL | Status: DC
  Filled 2023-11-08 (×2): qty 1

## 2023-11-08 MED ORDER — ASPIRIN 81 MG PO TBEC
81.0000 mg | DELAYED_RELEASE_TABLET | Freq: Every day | ORAL | Status: DC
  Administered 2023-11-09 – 2023-11-10 (×2): 81 mg via ORAL
  Filled 2023-11-08 (×2): qty 1

## 2023-11-08 MED ORDER — PREDNISONE 20 MG PO TABS
40.0000 mg | ORAL_TABLET | Freq: Every day | ORAL | Status: DC
  Administered 2023-11-10: 40 mg via ORAL
  Filled 2023-11-08: qty 2

## 2023-11-08 MED ORDER — METHYLPREDNISOLONE SODIUM SUCC 40 MG IJ SOLR
40.0000 mg | Freq: Two times a day (BID) | INTRAMUSCULAR | Status: AC
  Administered 2023-11-09 (×2): 40 mg via INTRAVENOUS
  Filled 2023-11-08 (×2): qty 1

## 2023-11-08 MED ORDER — ALBUTEROL SULFATE (2.5 MG/3ML) 0.083% IN NEBU: 2.5000 mg | INHALATION_SOLUTION | RESPIRATORY_TRACT | Status: DC | PRN

## 2023-11-08 MED ORDER — IPRATROPIUM-ALBUTEROL 0.5-2.5 (3) MG/3ML IN SOLN
3.0000 mL | Freq: Once | RESPIRATORY_TRACT | Status: AC
  Administered 2023-11-08: 3 mL via RESPIRATORY_TRACT
  Filled 2023-11-08: qty 3

## 2023-11-08 MED ORDER — IPRATROPIUM-ALBUTEROL 0.5-2.5 (3) MG/3ML IN SOLN
3.0000 mL | Freq: Four times a day (QID) | RESPIRATORY_TRACT | Status: DC
  Administered 2023-11-09 – 2023-11-10 (×5): 3 mL via RESPIRATORY_TRACT
  Filled 2023-11-08 (×5): qty 3

## 2023-11-08 MED ORDER — METHYLPREDNISOLONE SODIUM SUCC 125 MG IJ SOLR
125.0000 mg | Freq: Once | INTRAMUSCULAR | Status: AC
  Administered 2023-11-08: 125 mg via INTRAVENOUS
  Filled 2023-11-08: qty 2

## 2023-11-08 NOTE — ED Notes (Signed)
 RSV/Covid swab sent to lab by myself when pt was triaged.

## 2023-11-08 NOTE — ED Triage Notes (Signed)
 Pt arrives with c/o SOB that started 2 days ago. Pt reports runny nose, cough, and congestion. Pt has inspiratory and expiratory wheezing in triage. Pt was 82% on RA pt was put on 4L in triage came up to 90%.

## 2023-11-08 NOTE — ED Provider Notes (Signed)
 [facility] Provider Note    Event Date/Time   First MD Initiated Contact with Patient 11/08/23 2145     (approximate)   History   Shortness of Breath   HPI  Courtney Berger is a 58 y.o. female  htn, tobacco use who presents with 2-4 days of worsening cough shortness of breath and generalized weakness.  Patient reports that she developed a cough initially and became more wheezy over the past 48 hours.  She does not have a nebulizer at home.  She does not use oxygen at baseline.  Denies any chest pain abdominal pain fevers or chills or changes in urinary or bowel habits      Physical Exam   Triage Vital Signs: ED Triage Vitals  Encounter Vitals Group     BP 11/08/23 2141 (!) 172/102     Girls Systolic BP Percentile --      Girls Diastolic BP Percentile --      Boys Systolic BP Percentile --      Boys Diastolic BP Percentile --      Pulse Rate 11/08/23 2141 95     Resp 11/08/23 2141 (!) 26     Temp 11/08/23 2141 97.9 F (36.6 C)     Temp Source 11/08/23 2141 Oral     SpO2 11/08/23 2141 (!) 82 %     Weight --      Height --      Head Circumference --      Peak Flow --      Pain Score 11/08/23 2142 0     Pain Loc --      Pain Education --      Exclude from Growth Chart --     Most recent vital signs: Vitals:   11/08/23 2153 11/08/23 2300  BP:  138/70  Pulse: 98 93  Resp:  (!) 25  Temp:    SpO2: 96% 94%    Nursing Triage Note reviewed. Vital signs reviewed and patients oxygen saturation is hypoxic to 82% on room air  General: Patient is well nourished, well developed, awake and alert, appears unwell Head: Normocephalic and atraumatic Eyes: Normal inspection, extraocular muscles intact, no conjunctival pallor Ear, nose, throat: Normal external exam Neck: Normal range of motion Respiratory: Patient is in moderate respiratory distress, lungs with not moving good air, wheezes at the apices Cardiovascular: Patient is tachycardic, RR  without murmur appreciated GI: Abd SNT with no guarding or rebound  Back: Normal inspection of the back with good strength and range of motion throughout all ext Extremities: pulses intact with good cap refills, no LE pitting edema or calf tenderness Neuro: The patient is alert and oriented to person, place, and time, appropriately conversive, with 5/5 bilat UE/LE strength, no gross motor or sensory defects noted. Coordination appears to be adequate. Skin: Warm, dry, and intact Psych: normal mood and affect, no SI or HI  ED Results / Procedures / Treatments   Labs (all labs ordered are listed, but only abnormal results are displayed) Labs Reviewed  CBC - Abnormal; Notable for the following components:      Result Value   WBC 10.8 (*)    All other components within normal limits  BRAIN NATRIURETIC PEPTIDE - Abnormal; Notable for the following components:   B Natriuretic Peptide 119.8 (*)    All other components within normal limits  BLOOD GAS, VENOUS - Abnormal; Notable for the following components:   pO2, Ven 49 (*)  Bicarbonate 30.4 (*)    Acid-Base Excess 4.5 (*)    All other components within normal limits  RESP PANEL BY RT-PCR (RSV, FLU A&B, COVID)  RVPGX2  BASIC METABOLIC PANEL WITH GFR  PROCALCITONIN  HIV ANTIBODY (ROUTINE TESTING W REFLEX)  CBC  CREATININE, SERUM  COMPREHENSIVE METABOLIC PANEL WITH GFR  CBC     EKG EKG and rhythm strip are interpreted by myself:   EKG: [Normal sinus rhythm] at heart rate of 93, normal QRS duration, QTc 482, nonspecific ST segments and T waves no ectopy EKG not consistent with Acute STEMI Rhythm strip: NSR in lead II   RADIOLOGY XR chest: No acute abnormality on my independent review interpretation    PROCEDURES:  Critical Care performed: Yes, see critical care procedure note(s)  .Critical Care  Performed by: Nicholaus Rolland BRAVO, MD Authorized by: Nicholaus Rolland BRAVO, MD   Critical care provider statement:    Critical care  time (minutes):  32   Critical care was necessary to treat or prevent imminent or life-threatening deterioration of the following conditions:  Respiratory failure   Critical care was time spent personally by me on the following activities:  Development of treatment plan with patient or surrogate, discussions with consultants, evaluation of patient's response to treatment, examination of patient, ordering and review of laboratory studies, ordering and review of radiographic studies, ordering and performing treatments and interventions, pulse oximetry, re-evaluation of patient's condition and review of old charts   Care discussed with: admitting provider   Comments:     Need for 3 nebulizing treatments in succession for profound COPD exacerbation    MEDICATIONS ORDERED IN ED: Medications  aspirin  EC tablet 81 mg (has no administration in time range)  amLODipine  (NORVASC ) tablet 5 mg (has no administration in time range)  metoprolol  tartrate (LOPRESSOR ) tablet 50 mg (has no administration in time range)  enoxaparin  (LOVENOX ) injection 40 mg (has no administration in time range)  lactated ringers  infusion (has no administration in time range)  doxycycline  (VIBRA -TABS) tablet 100 mg (has no administration in time range)  methylPREDNISolone  sodium succinate (SOLU-MEDROL ) 40 mg/mL injection 40 mg (has no administration in time range)    Followed by  predniSONE  (DELTASONE ) tablet 40 mg (has no administration in time range)  ipratropium-albuterol  (DUONEB) 0.5-2.5 (3) MG/3ML nebulizer solution 3 mL (has no administration in time range)  albuterol  (PROVENTIL ) (2.5 MG/3ML) 0.083% nebulizer solution 2.5 mg (has no administration in time range)  nicotine  (NICODERM CQ  - dosed in mg/24 hours) patch 21 mg (has no administration in time range)  ipratropium-albuterol  (DUONEB) 0.5-2.5 (3) MG/3ML nebulizer solution 3 mL (3 mLs Nebulization Given 11/08/23 2205)  ipratropium-albuterol  (DUONEB) 0.5-2.5 (3) MG/3ML  nebulizer solution 3 mL (3 mLs Nebulization Given 11/08/23 2205)  albuterol  (PROVENTIL ) (2.5 MG/3ML) 0.083% nebulizer solution 2.5 mg (2.5 mg Nebulization Given 11/08/23 2204)  methylPREDNISolone  sodium succinate (SOLU-MEDROL ) 125 mg/2 mL injection 125 mg (125 mg Intravenous Given 11/08/23 2213)     IMPRESSION / MDM / ASSESSMENT AND PLAN / ED COURSE                                Differential diagnosis includes, but is not limited to, COPD exacerbation, pneumonia, CHF, arrhythmia, electrolyte derangement anemia  ED course: Patient presents acutely and is found to be profoundly hypoxic on room air despite not using oxygen at baseline.  She was brought back from triage and immediately given 3 breathing treatments  in succession along with 125 mg of Solu-Medrol  with some improvement.  Chest x-ray demonstrated no evidence of pneumonia and I do not see any profound elevation of inflammatory markers.  Will hold off on antibiotics currently.  Repeat pulmonary exam demonstrates improvement however patient will require admission tonight for scheduled nebulizing treatments.  She might need to meet with social work tomorrow to help afford a nebulizing machine and home medications.  Case discussed with hospitalist for admission       FINAL CLINICAL IMPRESSION(S) / ED DIAGNOSES   Final diagnoses:  COPD exacerbation (HCC)  Hypoxia     Rx / DC Orders   ED Discharge Orders     None        Note:  This document was prepared using Dragon voice recognition software and may include unintentional dictation errors.   Nicholaus Rolland BRAVO, MD 11/08/23 [phone]

## 2023-11-08 NOTE — ED Notes (Signed)
 ED Provider at bedside.

## 2023-11-08 NOTE — ED Triage Notes (Signed)
 PT arrives ambulatory, SOB, reporting that she has not felt well for a couple of days, SOB last night. Cough, unsure of fevers. Saturations 82% on RA. Placed on 2 liters without improvement, wheezing throughout. Increased to 4 liters with improvement to 91%.

## 2023-11-08 NOTE — H&P (Signed)
 History and Physical    Patient: Courtney Berger FMW:[id number] DOB: [birth year] DOA: 11/08/2023 DOS: the patient was seen and examined on 11/08/2023 PCP: [facility]  Patient coming from: Home  Chief Complaint:  Chief Complaint  Patient presents with   Shortness of Breath   HPI: Courtney Berger is a 58 y.o. female with medical history significant of COPD, continued tobacco abuse, morbid obesity, essential hypertension who presented to the ER with progressive shortness of breath cough and wheezing over the last 2 days.  Symptoms have progressively gotten worse.  Patient has not been on home O2 but present to the ER with oxygen sats in the 80s.  Patient currently requiring 4 L of oxygen.  Has received multiple doses of DuoNeb and a dose of steroid.  Still requiring 4 L of oxygen and having significant expiratory wheezing.  She has been admitted with acute exacerbation of COPD with acute hypoxic respiratory failure.  No chest pain.  No fever no chills.  No evidence of pneumonia.  Denied any sick contact.  Acute viral screen appears to be negative.  Review of Systems: As mentioned in the history of present illness. All other systems reviewed and are negative. Past Medical History:  Diagnosis Date   Asthma    Heart murmur    Hypertension    Umbilical hernia    Past Surgical History:  Procedure Laterality Date   DILATION AND CURETTAGE OF UTERUS     multiple   TONSILLECTOMY     WRIST SURGERY Right    Social History:  reports that she has been smoking. She has never used smokeless tobacco. She reports that she does not drink alcohol and does not use drugs.  Allergies  Allergen Reactions   Zyrtec [Cetirizine] Itching and Swelling    Family History  Problem Relation Age of Onset   Diabetes Mother    Congestive Heart Failure Mother    Hypertension Mother    Hyperlipidemia Mother    Parkinson's disease Father    Hypertension Father    Diabetes Father    Dementia Father     Diabetes Maternal Grandmother    Colon cancer Maternal Grandfather    Alzheimer's disease Paternal Grandmother    Diabetes Paternal Grandmother    Angina Paternal Grandfather    Lung cancer Paternal Grandfather     Prior to Admission medications   Medication Sig Start Date End Date Taking? Authorizing Provider  amLODipine  (NORVASC ) 5 MG tablet Take 5 mg by mouth daily.    [provider]  aspirin  EC 81 MG tablet Take 81 mg by mouth daily.    [provider]  CALCIUM-VITAMIN D PO Take 1 tablet by mouth daily.    [provider]  ELDERBERRY PO Take 2 capsules by mouth 4 (four) times a week.    [provider]  famotidine  (PEPCID ) 20 MG tablet Take 1 tablet (20 mg total) by mouth 2 (two) times daily. 07/07/20 09/05/20  Claudene Rover, MD  gentamicin  cream (GARAMYCIN ) 0.1 % Apply 1 application topically 2 (two) times daily. 08/09/20   Janit Thresa HERO, DPM  GLUCOSAMINE-CHONDROITIN PO Take 1 tablet by mouth daily.    [provider]  ibuprofen  (ADVIL ) 600 MG tablet Take 1 tablet (600 mg total) by mouth every 6 (six) hours as needed. 08/07/20   Saunders Shona CROME, PA-C  metoprolol  tartrate (LOPRESSOR ) 50 MG tablet Take 50 mg by mouth 2 (two) times daily. 11/20/18   [provider]  Multiple Vitamin (MULTIVITAMIN  WITH MINERALS) TABS tablet Take 1 tablet by mouth daily.    [provider]  vitamin B-12 (CYANOCOBALAMIN) 50 MCG tablet Take 50 mcg by mouth daily.    [provider]  vitamin C (ASCORBIC ACID) 500 MG tablet Take 500 mg by mouth daily.    [provider]  albuterol  (PROVENTIL  HFA) 108 (90 Base) MCG/ACT inhaler Inhale 2 puffs into the lungs every 4 (four) hours as needed for wheezing or shortness of breath. 01/31/19 08/07/20  Alona Knee, PA-C  hydrochlorothiazide  (HYDRODIURIL ) 25 MG tablet Take 1 tablet (25 mg total) by mouth daily. Patient not taking: Reported on 05/22/2017 12/27/16 08/07/20  Viviann Pastor, MD     Physical Exam: Vitals:   11/08/23 2141 11/08/23 2146 11/08/23 2153 11/08/23 2300  BP: (!) 172/102   138/70  Pulse: 95  98 93  Resp: (!) 26   (!) 25  Temp: 97.9 F (36.6 C)     TempSrc: Oral     SpO2: (!) 82% 90% 96% 94%   Constitutional: Acutely ill looking, in mild respiratory distress Eyes: PERRL, lids and conjunctivae normal ENMT: Mucous membranes are moist. Posterior pharynx clear of any exudate or lesions.Normal dentition.  Neck: normal, supple, no masses, no thyromegaly Respiratory: Decreased air entry bilaterally with marked expiratory wheezing, increased respiratory drive no accessory muscle use.  Cardiovascular: Sinus tachycardia, no murmurs / rubs / gallops. No extremity edema. 2+ pedal pulses. No carotid bruits.  Abdomen: no tenderness, no masses palpated. No hepatosplenomegaly. Bowel sounds positive.  Musculoskeletal: Good range of motion, no joint swelling or tenderness, Skin: no rashes, lesions, ulcers. No induration Neurologic: CN 2-12 grossly intact. Sensation intact, DTR normal. Strength 5/5 in all 4.  Psychiatric: Normal judgment and insight. Alert and oriented x 3.  Anxious mood  Data Reviewed:  Temperature97.9, blood pressure 170/102, pulse 98 respiratory 26 oxygen sat 82% on room air.  White count 10.8.Venous pH is 7.4.  Chemistry within normal.  BNP of 119,Hemoglobin and platelets appear to be within normal.  Acute viral screen is negative for influenza respiratory syncytial virus and COVID-19.  Chest x-ray showed no acute findings.  EKG shows sinus tachycardia  Assessment and Plan:  #1 acute hypoxic respiratory failure: Secondary to COPD exacerbation.  Patient is requiring up to 4 L of oxygen now.  Will continue with treatment of COPD exacerbation and titrating oxygen down and possibly off.  #2 acute exacerbation of COPD: Patient will be admitted.  Initiate IV Solu-Medrol  and transition to orals.  Oral antibiotics, DuoNebs, continue oxygen and titrate  off.  #3 essential hypertension: Will resume home regimen of antihypertensive.  Patient on amlodipine  and metoprolol .  #4 tobacco abuse: Tobacco cessation counseling given.  Initiate nicotine  patch.  #5 morbid obesity: Patient will get dietary counseling.    Advance Care Planning:   Code Status: Full Code   Consults: None  Family Communication: No family at bedside  Severity of Illness: The appropriate patient status for this patient is INPATIENT. Inpatient status is judged to be reasonable and necessary in order to provide the required intensity of service to ensure the patient's safety. The patient's presenting symptoms, physical exam findings, and initial radiographic and laboratory data in the context of their chronic comorbidities is felt to place them at high risk for further clinical deterioration. Furthermore, it is not anticipated that the patient will be medically stable for discharge from the hospital within 2 midnights of admission.   * I certify that at the point of admission  it is my clinical judgment that the patient will require inpatient hospital care spanning beyond 2 midnights from the point of admission due to high intensity of service, high risk for further deterioration and high frequency of surveillance required.*  AuthorBETHA SIM KNOLL, MD 11/08/2023 11:13 PM  For on call review [email or web address].

## 2023-11-08 NOTE — ED Notes (Signed)
 Pt to radiology at this time. Pt remains on 4L [place]

## 2023-11-09 DIAGNOSIS — E66813 Obesity, class 3: Secondary | ICD-10-CM | POA: Diagnosis not present

## 2023-11-09 DIAGNOSIS — J9621 Acute and chronic respiratory failure with hypoxia: Secondary | ICD-10-CM | POA: Diagnosis not present

## 2023-11-09 DIAGNOSIS — J441 Chronic obstructive pulmonary disease with (acute) exacerbation: Secondary | ICD-10-CM

## 2023-11-09 LAB — CBC
HCT: 43.2 % (ref 36.0–46.0)
Hemoglobin: 14.7 g/dL (ref 12.0–15.0)
MCH: 31 pg (ref 26.0–34.0)
MCHC: 34 g/dL (ref 30.0–36.0)
MCV: 91.1 fL (ref 80.0–100.0)
Platelets: 195 K/uL (ref 150–400)
RBC: 4.74 MIL/uL (ref 3.87–5.11)
RDW: 13.8 % (ref 11.5–15.5)
WBC: 10.7 K/uL — ABNORMAL HIGH (ref 4.0–10.5)
nRBC: 0 % (ref 0.0–0.2)

## 2023-11-09 LAB — COMPREHENSIVE METABOLIC PANEL WITH GFR
ALT: 12 U/L (ref 0–44)
AST: 18 U/L (ref 15–41)
Albumin: 4.1 g/dL (ref 3.5–5.0)
Alkaline Phosphatase: 54 U/L (ref 38–126)
Anion gap: 9 (ref 5–15)
BUN: 12 mg/dL (ref 6–20)
CO2: 26 mmol/L (ref 22–32)
Calcium: 8.9 mg/dL (ref 8.9–10.3)
Chloride: 100 mmol/L (ref 98–111)
Creatinine, Ser: 0.75 mg/dL (ref 0.44–1.00)
GFR, Estimated: >60 mL/min (ref >60)
Glucose, Bld: 140 mg/dL — ABNORMAL HIGH (ref 70–99)
Potassium: 4.5 mmol/L (ref 3.5–5.1)
Sodium: 135 mmol/L (ref 135–145)
Total Bilirubin: 0.6 mg/dL (ref 0.0–1.2)
Total Protein: 8.3 g/dL — ABNORMAL HIGH (ref 6.5–8.1)

## 2023-11-09 LAB — HIV ANTIBODY (ROUTINE TESTING W REFLEX): HIV Screen 4th Generation wRfx: NONREACTIVE

## 2023-11-09 LAB — PROCALCITONIN: Procalcitonin: <0.1 ng/mL

## 2023-11-09 MED ORDER — ENOXAPARIN SODIUM 60 MG/0.6ML IJ SOSY
60.0000 mg | PREFILLED_SYRINGE | INTRAMUSCULAR | Status: DC
  Filled 2023-11-09: qty 0.6

## 2023-11-09 NOTE — Progress Notes (Signed)
  Progress Note   Patient: Courtney Berger FMW:[id number] DOB: 1966-02-17 DOA: 11/08/2023     1 DOS: the patient was seen and examined on 11/09/2023   Brief hospital course: Courtney Berger is a 58 y.o. female with medical history significant of COPD, continued tobacco abuse, morbid obesity, essential hypertension who presented to the ER with progressive shortness of breath cough and wheezing over the last 2 days.  Oxygen saturation was in 80s upon arrival, was placed on 4 L oxygen.  She received multiple doses of nebulization.  Short of breath gradually improving.   Principal Problem:   Acute on chronic respiratory failure with hypoxemia (HCC) Active Problems:   Hypertension   Obesity with serious comorbidity   Tobacco abuse   COPD with acute exacerbation (HCC)   Class 3 obesity   Assessment and Plan: Acute hypoxemic respiratory failure secondary to COPD exacerbation. COPD exacerbation. Continued tobacco abuse. Patient had a significant worsening short of breath for the last 2 days.  She had also significant bronchospasm.  Her BNP is not elevated, procalcitonin level normal.  She is treated with IV steroids followed with oral for COPD exacerbation.  She is also on scheduled bronchodilator. Her condition appear to be improving today, try to wean off oxygen today. Continue steroids.  Cessation of tobacco strongly urged.  Class III obesity with BMI 45.26. Diet and exercise advised.  Essential hypertension. Continue monitor blood pressure.     Subjective:  Short of breath better today, still has some wheezing.  Physical Exam: Vitals:   11/09/23 0619 11/09/23 0700 11/09/23 0730 11/09/23 0855  BP:  (!) 136/95 (!) 162/99   Pulse:  66 66 79  Resp:  (!) 21 (!) 23 18  Temp:   98 F (36.7 C)   TempSrc:   Oral   SpO2:  96% 95% 96%  Weight: 123.4 kg     Height:       General exam: Appears calm and comfortable, morbid obese. Respiratory system: Decreased breath sounds with  wheezes. Respiratory effort normal. Cardiovascular system: S1 & S2 heard, RRR. No JVD, murmurs, rubs, gallops or clicks. No pedal edema. Gastrointestinal system: Abdomen is nondistended, soft and nontender. No organomegaly or masses felt. Normal bowel sounds heard. Central nervous system: Alert and oriented. No focal neurological deficits. Extremities: Symmetric 5 x 5 power. Skin: No rashes, lesions or ulcers Psychiatry: Judgement and insight appear normal. Mood & affect appropriate.    Data Reviewed:  Chest x-ray and lab results reviewed  Family Communication: None  Disposition: Status is: Inpatient Remains inpatient appropriate because: Severity of disease, IV treatment.     Time spent: 35 minutes  Author: Murvin Mana, MD 11/09/2023 10:43 AM  For on call review [email or web address].

## 2023-11-09 NOTE — Hospital Course (Signed)
 Courtney Berger is a 58 y.o. female with medical history significant of COPD, continued tobacco abuse, morbid obesity, essential hypertension who presented to the ER with progressive shortness of breath cough and wheezing over the last 2 days.  Oxygen saturation was in 80s upon arrival, was placed on 4 L oxygen.  She received multiple doses of nebulization.  Short of breath gradually improving.

## 2023-11-09 NOTE — ED Notes (Signed)
 Pt informed she has a bed ready upstairs- pt very upset and yelling that she is smelly, she doesn't want to dirty another room, and it's a waste of government money. Pt states just give me whatever meds you're gonna give me, I'm not doing this. Pt educated on flow of admissions, informed she can get a shower on the floor, informed she needs to stay due to O2 use, and informed she can be moved in the same bed. Pt throwing of [place] and states I don't understand! This makes no sense! Consulting civil engineer and MD notified.

## 2023-11-09 NOTE — Progress Notes (Signed)
 Patient admitted from ED. She is alert and oriented X 4.pt is really angry and frustrated.pt statesI supposed to be in home, not in a hospital bed, they are just wasting the government money this RN and charge nurse Daphne explained the reason of admission. Got new order for shower. Pt refused to complete the admission profile question and will do it later when time allows. Denies any pain.

## 2023-11-09 NOTE — ED Notes (Addendum)
 Spoke with pt w/ charge RN and re-explained admission process and reason for stay. Pt expressing frustrations w/ being asked by different care team members if she uses oxygen at home (she does not), the poor quality hospital food, and is concerned her jobs are going to fire her and that she can't work although she needs to. Verbal reassurance given. Pt agrees to go to room but states she will only walk. Pt walked halfway to room due to refusing wheelchair, wheelchair used for the rest of the transport due to pt fatigue.

## 2023-11-09 NOTE — Evaluation (Signed)
 Physical Therapy Evaluation Patient Details Name: Courtney Berger MRN: [id number] DOB: [birth year] Today's Date: 11/09/2023  History of Present Illness  Pt is a 58 year old female presented to the ED admitted with Acute hypoxemic respiratory failure secondary to COPD exacerbation.    PMH significant for significant of COPD, continued tobacco abuse, morbid obesity, essential hypertension  Clinical Impression  Pt did well with all aspects of mobility, strength, balance, etc.  She did have some issues with cough, O2 during prolonged bout of ambulation but had no safety issues.  She did ~400 ft w/o AD or O2. O2 slowly did come down as low as 86% but stayed >90% for first 300 ft... Pt cleared from a PT perspective, will sign off.       If plan is discharge home, recommend the following:     Can travel by private vehicle        Equipment Recommendations None recommended by PT  Recommendations for Other Services       Functional Status Assessment Patient has not had a recent decline in their functional status (shortness of breath)     Precautions / Restrictions Precautions Precautions: None Restrictions Weight Bearing Restrictions Per Provider Order: No      Mobility  Bed Mobility Overal bed mobility: Independent                  Transfers Overall transfer level: Independent                      Ambulation/Gait Ambulation/Gait assistance: Independent Gait Distance (Feet): 400 Feet Assistive device: None         General Gait Details: ~400 ft w/o AD or O2.  O2 slowly did come down as low as 86% but stayed >90% for first 300 ft.  PT with no LOBs, cuing for focused breathing.  Stairs            Wheelchair Mobility     Tilt Bed    Modified Rankin (Stroke Patients Only)       Balance     Sitting balance-Leahy Scale: Normal       Standing balance-Leahy Scale: Normal                               Pertinent Vitals/Pain Pain  Assessment Pain Assessment: No/denies pain    Home Living Family/patient expects to be discharged to:: Private residence Living Arrangements: Children Available Help at Discharge: Family Type of Home: House Home Access: Stairs to enter   Secretary/administrator of Steps: 5   Home Layout: One level        Prior Function Prior Level of Function : Independent/Modified Independent             Mobility Comments: amb with no AD - works 2 jobs, standing/walking/ladders/etc ADLs Comments: indep with ADL/IADL, works 2-3 jobs     Extremity/Trunk Assessment   Upper Extremity Assessment Upper Extremity Assessment: Overall WFL for tasks assessed    Lower Extremity Assessment Lower Extremity Assessment: Overall WFL for tasks assessed       Communication   Communication Communication: No apparent difficulties    Cognition Arousal: Alert Behavior During Therapy: WFL for tasks assessed/performed   PT - Cognitive impairments: No apparent impairments                         Following commands: Intact  Cueing Cueing Techniques: Verbal cues     General Comments General comments (skin integrity, edema, etc.): spo2 to 82% on RA during amb attempts    Exercises     Assessment/Plan    PT Assessment Patient does not need any further PT services  PT Problem List         PT Treatment Interventions      PT Goals (Current goals can be found in the Care Plan section)  Acute Rehab PT Goals Patient Stated Goal: go home PT Goal Formulation: All assessment and education complete, DC therapy    Frequency       Co-evaluation               AM-PAC PT 6 Clicks Mobility  Outcome Measure Help needed turning from your back to your side while in a flat bed without using bedrails?: None Help needed moving from lying on your back to sitting on the side of a flat bed without using bedrails?: None Help needed moving to and from a bed to a chair (including a  wheelchair)?: None Help needed standing up from a chair using your arms (e.g., wheelchair or bedside chair)?: None Help needed to walk in hospital room?: None Help needed climbing 3-5 steps with a railing? : None 6 Click Score: 24    End of Session Equipment Utilized During Treatment: Oxygen (on 4L on arrival 99%, maintains mid 90s on 2L, ambulation on room air did yield drop to 86%) Activity Tolerance: Patient tolerated treatment well;Patient limited by fatigue Patient left: in bed;with call bell/phone within reach Nurse Communication: Mobility status (O2) PT Visit Diagnosis: Muscle weakness (generalized) (M62.81)    Time: 1440-1500 PT Time Calculation (min) (ACUTE ONLY): 20 min   Charges:   PT Evaluation $PT Eval Low Complexity: 1 Low   PT General Charges $$ ACUTE PT VISIT: 1 Visit         Carmin JONELLE Deed, DPT 11/09/2023, 4:30 PM

## 2023-11-09 NOTE — Evaluation (Signed)
 Occupational Therapy Evaluation Patient Details Name: Courtney Berger MRN: [id number] DOB: 1965/08/05 Today's Date: 11/09/2023   History of Present Illness   Pt is a 58 year old female presented to the ED admitted with Acute hypoxemic respiratory failure secondary to COPD exacerbation.    PMH significant for significant of COPD, continued tobacco abuse, morbid obesity, essential hypertension     Clinical Impressions Chart reviwed to date, pt greeted semi supine in bed, agreeable to OT evaluation. PTA pt is indep in ADL/IADL, amb with no AD. Pt is performing ADL close to her functional baseline however does require supervision for amb with no AD as her spo2 drops to 82% on RA, up to 88% with standing rest break, PLB after approx 1 minute. Pt is mildly SOB. No OT needs identified at this time as pt demos appropriate carry over of energy conservation techniques provided on this date, performing ADL grossly with supervision-MOD I. OT will sign off, please re consult if there is a change in functional status.    If plan is discharge home, recommend the following:   Assist for transportation     Functional Status Assessment   Patient has not had a recent decline in their functional status     Equipment Recommendations   Tub/shower seat     Recommendations for Other Services         Precautions/Restrictions   Precautions Precautions: None     Mobility Bed Mobility Overal bed mobility: Independent                  Transfers Overall transfer level: Independent                        Balance Overall balance assessment: Needs assistance   Sitting balance-Leahy Scale: Normal       Standing balance-Leahy Scale: Good                             ADL either performed or assessed with clinical judgement   ADL Overall ADL's : Needs assistance/impaired                     Lower Body Dressing: Modified independent   Toilet  Transfer: Radiographer, therapeutic Details (indicate cue type and reason): slip on shoes- reports she cannot wear regular shoes because it hurts her toes Toileting- Clothing Manipulation and Hygiene: Modified independent       Functional mobility during ADLs: Supervision/safety (approx 150' no AD, intermittent vcs for energy conservation techniques)       Vision Patient Visual Report: No change from baseline       Perception         Praxis         Pertinent Vitals/Pain Pain Assessment Pain Assessment: No/denies pain     Extremity/Trunk Assessment Upper Extremity Assessment Upper Extremity Assessment: Overall WFL for tasks assessed   Lower Extremity Assessment Lower Extremity Assessment: Overall WFL for tasks assessed       Communication Communication Communication: No apparent difficulties   Cognition Arousal: Alert Behavior During Therapy: WFL for tasks assessed/performed Cognition: No apparent impairments                               Following commands: Intact       Cueing  General Comments   Cueing Techniques: Verbal cues  spo2  to 82% on RA during amb attempts   Exercises Other Exercises Other Exercises: edu re role of OT, role of rehab, energy conservation techniques including use of AE/DME   Shoulder Instructions      Home Living Family/patient expects to be discharged to:: Private residence Living Arrangements: Children Available Help at Discharge: Family Type of Home: House Home Access: Stairs to enter Secretary/administrator of Steps: 5   Home Layout: One level (basement but have to access outside)                          Prior Functioning/Environment Prior Level of Function : Independent/Modified Independent             Mobility Comments: amb with no AD ADLs Comments: indep with ADL/IADL, works 2-3 jobs    OT Problem List:     OT Treatment/Interventions:        OT Goals(Current goals can  be found in the care plan section)   Acute Rehab OT Goals Patient Stated Goal: go home OT Goal Formulation: With patient Time For Goal Achievement: 11/23/23 Potential to Achieve Goals: Good   OT Frequency:       Co-evaluation              AM-PAC OT 6 Clicks Daily Activity     Outcome Measure Help from another person eating meals?: None Help from another person taking care of personal grooming?: None Help from another person toileting, which includes using toliet, bedpan, or urinal?: None Help from another person bathing (including washing, rinsing, drying)?: None Help from another person to put on and taking off regular upper body clothing?: None Help from another person to put on and taking off regular lower body clothing?: None 6 Click Score: 24   End of Session Equipment Utilized During Treatment: Oxygen Nurse Communication: Mobility status (team re: sats while amb)  Activity Tolerance: Patient tolerated treatment well Patient left: in bed;with call bell/phone within reach                   Time: 1415-1430 OT Time Calculation (min): 15 min Charges:  OT General Charges $OT Visit: 1 Visit OT Evaluation $OT Eval Low Complexity: 1 Low Therisa Sheffield, OTD OTR/L  11/09/23, 3:52 PM

## 2023-11-09 NOTE — Progress Notes (Signed)
 PHARMACIST - PHYSICIAN COMMUNICATION  CONCERNING:  Enoxaparin  (Lovenox ) for DVT Prophylaxis    RECOMMENDATION: Patient was prescribed enoxaprin 40mg  q24 hours for VTE prophylaxis.   Filed Weights   11/09/23 0619  Weight: 123.4 kg (272 lb)    Body mass index is 45.26 kg/m.  Estimated Creatinine Clearance: 101.2 mL/min (by C-G formula based on SCr of 0.75 mg/dL).   Based on [facility] policy patient is candidate for enoxaparin  0.5mg /kg TBW SQ every 24 hours based on BMI being >30.  DESCRIPTION: Pharmacy has adjusted enoxaparin  dose per [facility] policy.  Patient is now receiving enoxaparin  0.5 mg/kg every 24 hours   Rankin CANDIE Dills, PharmD, [facility] 11/09/2023 6:20 AM

## 2023-11-10 ENCOUNTER — Other Ambulatory Visit (HOSPITAL_COMMUNITY): Payer: Self-pay

## 2023-11-10 ENCOUNTER — Other Ambulatory Visit: Payer: Self-pay

## 2023-11-10 DIAGNOSIS — E66813 Obesity, class 3: Secondary | ICD-10-CM | POA: Diagnosis not present

## 2023-11-10 DIAGNOSIS — J9621 Acute and chronic respiratory failure with hypoxia: Secondary | ICD-10-CM | POA: Diagnosis not present

## 2023-11-10 DIAGNOSIS — J441 Chronic obstructive pulmonary disease with (acute) exacerbation: Secondary | ICD-10-CM | POA: Diagnosis not present

## 2023-11-10 MED ORDER — ADVAIR HFA 230-21 MCG/ACT IN AERO
2.0000 | INHALATION_SPRAY | Freq: Two times a day (BID) | RESPIRATORY_TRACT | 0 refills | Status: AC
  Filled 2023-11-10: qty 12, 30d supply, fill #0

## 2023-11-10 MED ORDER — GUAIFENESIN ER 600 MG PO TB12: 1200.0000 mg | ORAL_TABLET | Freq: Two times a day (BID) | ORAL | 0 refills | Status: DC

## 2023-11-10 MED ORDER — NICOTINE 21 MG/24HR TD PT24: 21.0000 mg | MEDICATED_PATCH | Freq: Every day | TRANSDERMAL | 0 refills | Status: DC

## 2023-11-10 MED ORDER — ASPIRIN EC 81 MG PO TBEC
81.0000 mg | DELAYED_RELEASE_TABLET | Freq: Every day | ORAL | 11 refills | Status: AC
  Filled 2023-11-10: qty 30, 30d supply, fill #0

## 2023-11-10 MED ORDER — ENSURE PLUS HIGH PROTEIN PO LIQD: 237.0000 mL | Freq: Two times a day (BID) | ORAL | Status: DC

## 2023-11-10 MED ORDER — FLUTICASONE-SALMETEROL 230-21 MCG/ACT IN AERO: 2.0000 | INHALATION_SPRAY | Freq: Two times a day (BID) | RESPIRATORY_TRACT | 0 refills | Status: DC

## 2023-11-10 MED ORDER — ALBUTEROL SULFATE HFA 108 (90 BASE) MCG/ACT IN AERS
2.0000 | INHALATION_SPRAY | RESPIRATORY_TRACT | 0 refills | Status: AC | PRN
  Filled 2023-11-10: qty 18, 30d supply, fill #0

## 2023-11-10 MED ORDER — AMLODIPINE BESYLATE 10 MG PO TABS
10.0000 mg | ORAL_TABLET | Freq: Every day | ORAL | 0 refills | Status: AC
  Filled 2023-11-10: qty 30, 30d supply, fill #0

## 2023-11-10 MED ORDER — ADULT MULTIVITAMIN W/MINERALS CH: 1.0000 | ORAL_TABLET | Freq: Every day | ORAL | Status: DC

## 2023-11-10 MED ORDER — METOPROLOL TARTRATE 50 MG PO TABS
50.0000 mg | ORAL_TABLET | Freq: Two times a day (BID) | ORAL | 0 refills | Status: AC
  Filled 2023-11-10: qty 60, 30d supply, fill #0

## 2023-11-10 MED ORDER — PREDNISONE 20 MG PO TABS
ORAL_TABLET | ORAL | 0 refills | Status: AC
  Filled 2023-11-10: qty 9, 8d supply, fill #0

## 2023-11-10 MED ORDER — GUAIFENESIN-DM 100-10 MG/5ML PO SYRP
5.0000 mL | ORAL_SOLUTION | ORAL | Status: DC | PRN
  Administered 2023-11-10: 5 mL via ORAL
  Filled 2023-11-10: qty 10

## 2023-11-10 MED ORDER — GUAIFENESIN ER 600 MG PO TB12
1200.0000 mg | ORAL_TABLET | Freq: Two times a day (BID) | ORAL | 0 refills | Status: AC
  Filled 2023-11-10: qty 28, 7d supply, fill #0

## 2023-11-10 MED ORDER — NICOTINE 21 MG/24HR TD PT24
21.0000 mg | MEDICATED_PATCH | Freq: Every day | TRANSDERMAL | 0 refills | Status: AC
  Filled 2023-11-10: qty 28, 28d supply, fill #0

## 2023-11-10 MED ORDER — ALBUTEROL SULFATE HFA 108 (90 BASE) MCG/ACT IN AERS: 2.0000 | INHALATION_SPRAY | RESPIRATORY_TRACT | 0 refills | Status: DC | PRN

## 2023-11-10 MED ORDER — PREDNISONE 20 MG PO TABS: ORAL_TABLET | ORAL | 0 refills | Status: DC

## 2023-11-10 NOTE — Discharge Summary (Signed)
 Physician Discharge Summary   Patient: Courtney Berger MRN: [id number] DOB: 22-Apr-1965  Admit date:     11/08/2023  Discharge date: 11/10/23  Discharge Physician: Murvin Mana   PCP: [facility]   Recommendations at discharge:   Follow-up with PCP in 1 week.  Discharge Diagnoses: Principal Problem:   Acute on chronic respiratory failure with hypoxemia (HCC) Active Problems:   Hypertension   Obesity with serious comorbidity   Tobacco abuse   COPD with acute exacerbation (HCC)   Class 3 obesity  Resolved Problems:   * No resolved hospital problems. *  Hospital Course: Courtney Berger is a 58 y.o. female with medical history significant of COPD, continued tobacco abuse, morbid obesity, essential hypertension who presented to the ER with progressive shortness of breath cough and wheezing over the last 2 days.  Oxygen saturation was in 80s upon arrival, was placed on 4 L oxygen.  She received multiple doses of nebulization.  Short of breath gradually improving.  Assessment and Plan:  Acute hypoxemic respiratory failure secondary to COPD exacerbation. COPD exacerbation. Continued tobacco abuse. Patient had a significant worsening short of breath for the last 2 days.  She had also significant bronchospasm.  Her BNP is not elevated, procalcitonin level normal.  She is treated with IV steroids followed with oral for COPD exacerbation.  She is also on scheduled bronchodilator.  Cessation of tobacco strongly urged. Patient condition has improved today, will continue steroid taper.  Also scheduled bronchodilator.  Patient will be followed by PCP as outpatient.  Will also obtain home oxygen evaluation before discharge.   Class III obesity with BMI 45.26. Diet and exercise advised.   Essential hypertension. Continue monitor blood pressure.      Consultants: None Procedures performed: None  Disposition: Home Diet recommendation:  Discharge Diet Orders (From admission,  onward)     Start     Ordered   11/10/23 0000  Diet - low sodium heart healthy        11/10/23 1009           Cardiac diet DISCHARGE MEDICATION: Allergies as of 11/10/2023       Reactions   Zyrtec [cetirizine] Itching, Swelling        Medication List     STOP taking these medications    ascorbic acid 500 MG tablet Commonly known as: VITAMIN C   CALCIUM-VITAMIN D PO   ELDERBERRY PO   GLUCOSAMINE-CHONDROITIN PO   vitamin B-12 50 MCG tablet Commonly known as: CYANOCOBALAMIN       TAKE these medications    albuterol  108 (90 Base) MCG/ACT inhaler Commonly known as: VENTOLIN  HFA Inhale 2 puffs into the lungs every 4 (four) hours as needed.   amLODipine  10 MG tablet Commonly known as: NORVASC  Take 10 mg by mouth daily.   aspirin  EC 81 MG tablet Take 81 mg by mouth daily.   fluticasone -salmeterol 230-21 MCG/ACT inhaler Commonly known as: Advair  HFA Inhale 2 puffs into the lungs 2 (two) times daily.   guaiFENesin  600 MG 12 hr tablet Commonly known as: Mucinex  Take 2 tablets (1,200 mg total) by mouth 2 (two) times daily for 7 days.   metoprolol  tartrate 50 MG tablet Commonly known as: LOPRESSOR  Take 50 mg by mouth 2 (two) times daily.   multivitamin with minerals Tabs tablet Take 1 tablet by mouth at bedtime.   nicotine  21 mg/24hr patch Commonly known as: NICODERM CQ  - dosed in mg/24 hours Place 1 patch (21 mg total) onto the  skin daily. Start taking on: November 11, 2023   predniSONE  20 MG tablet Commonly known as: DELTASONE  Take 2 tablets (40 mg total) by mouth daily with breakfast for 2 days, THEN 1 tablet (20 mg total) daily with breakfast for 3 days, THEN 0.5 tablets (10 mg total) daily with breakfast for 3 days. Start taking on: November 11, 2023        Follow-up Information     [facility] Follow up in 1 week(s).   Contact information: [address] [place] [address] [phone]                Discharge  Exam: Courtney Berger   11/09/23 9380  Weight: 123.4 kg   General exam: Appears calm and comfortable, obese Respiratory system: Decreased breath sounds without crackles. Respiratory effort normal. Cardiovascular system: S1 & S2 heard, RRR. No JVD, murmurs, rubs, gallops or clicks. No pedal edema. Gastrointestinal system: Abdomen is nondistended, soft and nontender. No organomegaly or masses felt. Normal bowel sounds heard. Central nervous system: Alert and oriented. No focal neurological deficits. Extremities: Symmetric 5 x 5 power. Skin: No rashes, lesions or ulcers Psychiatry: Judgement and insight appear normal. Mood & affect appropriate.    Condition at discharge: good  The results of significant diagnostics from this hospitalization (including imaging, microbiology, ancillary and laboratory) are listed below for reference.   Imaging Studies: DG Chest 2 View Result Date: 11/08/2023 CLINICAL DATA:  Short of breath, wheezing, hypoxia EXAM: CHEST - 2 VIEW COMPARISON:  07/07/2020 FINDINGS: Frontal and lateral views of the chest demonstrate an unremarkable cardiac silhouette. No acute airspace disease, effusion, or pneumothorax. No acute bony abnormalities. IMPRESSION: 1. No acute intrathoracic process. Electronically Signed   By: Ozell Daring M.D.   On: 11/08/2023 22:13    Microbiology: Results for orders placed or performed during the hospital encounter of 11/08/23  Resp panel by RT-PCR (RSV, Flu A&B, Covid) Anterior Nasal Swab     Status: None   Collection Time: 11/08/23  9:42 PM   Specimen: Anterior Nasal Swab  Result Value Ref Range Status   SARS Coronavirus 2 by RT PCR NEGATIVE NEGATIVE Final    Comment: (NOTE) SARS-CoV-2 target nucleic acids are NOT DETECTED.  The SARS-CoV-2 RNA is generally detectable in upper respiratory specimens during the acute phase of infection. The lowest concentration of SARS-CoV-2 viral copies this assay can detect is 138 copies/mL. A negative  result does not preclude SARS-Cov-2 infection and should not be used as the sole basis for treatment or other patient management decisions. A negative result may occur with  improper specimen collection/handling, submission of specimen other than nasopharyngeal swab, presence of viral mutation(s) within the areas targeted by this assay, and inadequate number of viral copies(<138 copies/mL). A negative result must be combined with clinical observations, patient history, and epidemiological information. The expected result is Negative.  Fact Sheet for Patients:  BloggerCourse.com  Fact Sheet for Healthcare Providers:  SeriousBroker.it  This test is no t yet approved or cleared by the [place]  FDA and  has been authorized for detection and/or diagnosis of SARS-CoV-2 by FDA under an Emergency Use Authorization (EUA). This EUA will remain  in effect (meaning this test can be used) for the duration of the COVID-19 declaration under Section 564(b)(1) of the Act, 21 U.S.C.section 360bbb-3(b)(1), unless the authorization is terminated  or revoked sooner.       Influenza A by PCR NEGATIVE NEGATIVE Final   Influenza B by PCR NEGATIVE  NEGATIVE Final    Comment: (NOTE) The Xpert Xpress SARS-CoV-2/FLU/RSV plus assay is intended as an aid in the diagnosis of influenza from Nasopharyngeal swab specimens and should not be used as a sole basis for treatment. Nasal washings and aspirates are unacceptable for Xpert Xpress SARS-CoV-2/FLU/RSV testing.  Fact Sheet for Patients: BloggerCourse.com  Fact Sheet for Healthcare Providers: SeriousBroker.it  This test is not yet approved or cleared by the [place]  FDA and has been authorized for detection and/or diagnosis of SARS-CoV-2 by FDA under an Emergency Use Authorization (EUA). This EUA will remain in effect (meaning this test can be used)  for the duration of the COVID-19 declaration under Section 564(b)(1) of the Act, 21 U.S.C. section 360bbb-3(b)(1), unless the authorization is terminated or revoked.     Resp Syncytial Virus by PCR NEGATIVE NEGATIVE Final    Comment: (NOTE) Fact Sheet for Patients: BloggerCourse.com  Fact Sheet for Healthcare Providers: SeriousBroker.it  This test is not yet approved or cleared by the [place]  FDA and has been authorized for detection and/or diagnosis of SARS-CoV-2 by FDA under an Emergency Use Authorization (EUA). This EUA will remain in effect (meaning this test can be used) for the duration of the COVID-19 declaration under Section 564(b)(1) of the Act, 21 U.S.C. section 360bbb-3(b)(1), unless the authorization is terminated or revoked.  Performed at [facility], [address] [place], [place] [address]     Labs: CBC: Recent Labs  Lab 11/08/23 2214 11/09/23 0442  WBC 10.8* 10.7*  HGB 15.0 14.7  HCT 44.2 43.2  MCV 91.7 91.1  PLT 179 195   Basic Metabolic Panel: Recent Labs  Lab 11/08/23 2214 11/09/23 0442  NA 135 135  K 4.2 4.5  CL 99 100  CO2 24 26  GLUCOSE 99 140*  BUN 9 12  CREATININE 0.73 0.75  CALCIUM 9.0 8.9   Liver Function Tests: Recent Labs  Lab 11/09/23 0442  AST 18  ALT 12  ALKPHOS 54  BILITOT 0.6  PROT 8.3*  ALBUMIN 4.1   CBG: No results for input(s): GLUCAP in the last 168 hours.  Discharge time spent: 35 minutes.  Signed: Murvin Mana, MD [facility] 11/10/2023

## 2023-11-10 NOTE — Progress Notes (Signed)
SATURATION QUALIFICATIONS: (This note is used to comply with regulatory documentation for home oxygen)  Patient Saturations on Room Air at Rest = 90%  Patient Saturations on Room Air while Ambulating 89-91%

## 2023-11-10 NOTE — Plan of Care (Signed)
  Problem: Clinical Measurements: Goal: Ability to maintain clinical measurements within normal limits will improve Outcome: Progressing   Problem: Activity: Goal: Risk for activity intolerance will decrease Outcome: Progressing   Problem: Coping: Goal: Level of anxiety will decrease Outcome: Progressing   Problem: Elimination: Goal: Will not experience complications related to bowel motility Outcome: Progressing   Problem: Safety: Goal: Ability to remain free from injury will improve Outcome: Progressing   Problem: Education: Goal: Knowledge of disease or condition will improve Outcome: Progressing

## 2023-11-10 NOTE — Progress Notes (Signed)
 Pt alert; overall pleasant. Pt voiced unable to sleep because of the cough/ congested; Notified Erminio, NP. Standing order of Robitussin given.

## 2023-11-10 NOTE — Progress Notes (Signed)
 Initial Nutrition Assessment  DOCUMENTATION CODES:   Not applicable  INTERVENTION:   -MVI with minerals daily -Ensure Plus High Protein po BID, each supplement provides 350 kcal and 20 grams of protein  -Liberalize diet to regular fir widest variety of meal selections  NUTRITION DIAGNOSIS:   Increased nutrient needs related to chronic illness (COPD) as evidenced by estimated needs.  GOAL:   Patient will meet greater than or equal to 90% of their needs  MONITOR:   PO intake, Supplement acceptance  REASON FOR ASSESSMENT:   Consult Assessment of nutrition requirement/status  ASSESSMENT:   Pt with medical history significant of COPD, continued tobacco abuse, morbid obesity, essential hypertension who presented with progressive shortness of breath cough and wheezing over the last 2 days PTA.  Symptoms have progressively gotten worse.  Pt admitted with acute COPD exacerbation.   Reviewed I/O's: +480 ml since admissions  Pt unavailable at time of visit. Attempted to speak with pt via call to hospital room phone, however, unable to reach. RD unable to obtain further nutrition-related history or complete nutrition-focused physical exam at this time.    Pt currently on a regular diet. No meal completion data available to assess at this time.   No wt loss noted over the past several years. Per nursing assessment, pt with edema which may be masking true weight loss as well as fat and muscle depletions.   Obesity is a complex, chronic medical condition that is optimally managed by a multidisciplinary care team. Weight loss is not an ideal goal for an acute inpatient hospitalization. However, if further work-up for obesity is warranted, consider outpatient referral to [place].    Medications reviewed and include lovenox  and prednisone .   Labs reviewed.    Diet Order:   Diet Order             Diet Heart Room service appropriate?  Yes; Fluid consistency: Thin  Diet effective now                   EDUCATION NEEDS:   No education needs have been identified at this time  Skin:  Skin Assessment: Reviewed RN Assessment  Last BM:  11/09/23  Height:   Ht Readings from Last 1 Encounters:  11/09/23 5' 5 (1.651 m)    Weight:   Wt Readings from Last 1 Encounters:  11/09/23 123.4 kg    Ideal Body Weight:  56.8 kg  BMI:  Body mass index is 45.26 kg/m.  Estimated Nutritional Needs:   Kcal:  1700-1900  Protein:  90-105 grams  Fluid:  1.7-1.9 L    Margery ORN, RD, LDN, CDCES Registered Dietitian III Certified Diabetes Care and Education Specialist If unable to reach this RD, please use RD Inpatient group chat on secure chat between hours of 8am-4 pm daily
